# Patient Record
Sex: Male | Born: 1970 | Race: White | Hispanic: No | Marital: Single | Smoking: Never smoker
Health system: Southern US, Community
[De-identification: ages and names within clinical notes are randomized; demographics above are authoritative.]

## PROBLEM LIST (undated history)

## (undated) DIAGNOSIS — K529 Noninfective gastroenteritis and colitis, unspecified: Secondary | ICD-10-CM

## (undated) DIAGNOSIS — F419 Anxiety disorder, unspecified: Secondary | ICD-10-CM

## (undated) DIAGNOSIS — G43909 Migraine, unspecified, not intractable, without status migrainosus: Secondary | ICD-10-CM

## (undated) DIAGNOSIS — I10 Essential (primary) hypertension: Secondary | ICD-10-CM

## (undated) DIAGNOSIS — T7840XA Allergy, unspecified, initial encounter: Secondary | ICD-10-CM

## (undated) DIAGNOSIS — K219 Gastro-esophageal reflux disease without esophagitis: Secondary | ICD-10-CM

## (undated) DIAGNOSIS — E785 Hyperlipidemia, unspecified: Secondary | ICD-10-CM

## (undated) DIAGNOSIS — R0981 Nasal congestion: Secondary | ICD-10-CM

## (undated) DIAGNOSIS — Z87442 Personal history of urinary calculi: Secondary | ICD-10-CM

## (undated) HISTORY — DX: Anxiety disorder, unspecified: F41.9

## (undated) HISTORY — DX: Personal history of urinary calculi: Z87.442

## (undated) HISTORY — DX: Nasal congestion: R09.81

## (undated) HISTORY — DX: Noninfective gastroenteritis and colitis, unspecified: K52.9

## (undated) HISTORY — DX: Allergy, unspecified, initial encounter: T78.40XA

## (undated) HISTORY — PX: WISDOM TOOTH EXTRACTION: SHX21

## (undated) HISTORY — DX: Hyperlipidemia, unspecified: E78.5

---

## 1997-07-04 HISTORY — PX: CYST REMOVAL NECK: SHX6281

## 2008-04-11 ENCOUNTER — Ambulatory Visit: Payer: Self-pay | Admitting: Family Medicine

## 2009-02-11 IMAGING — CR PARANASAL SINUSES - COMPLETE 3 + VIEW
1 series · 6 of 6 positions shown · non-contrast
Comparison: none

REASON FOR EXAM: COUGH  WHEEZE
COMMENTS:

PROCEDURE:     DXR - DXR SINUSES PARANASAL COMPLETE  - April 11, 2008 [DATE]
RESULT:     The paranasal sinuses are well aerated without evidence of air
fluid levels nor mucosal thickening. The visualized bony skeleton is
unremarkable.

[Series 1: view not recorded · 0.17mm/px · 6 of 6 slices shown]
[im 1/6]
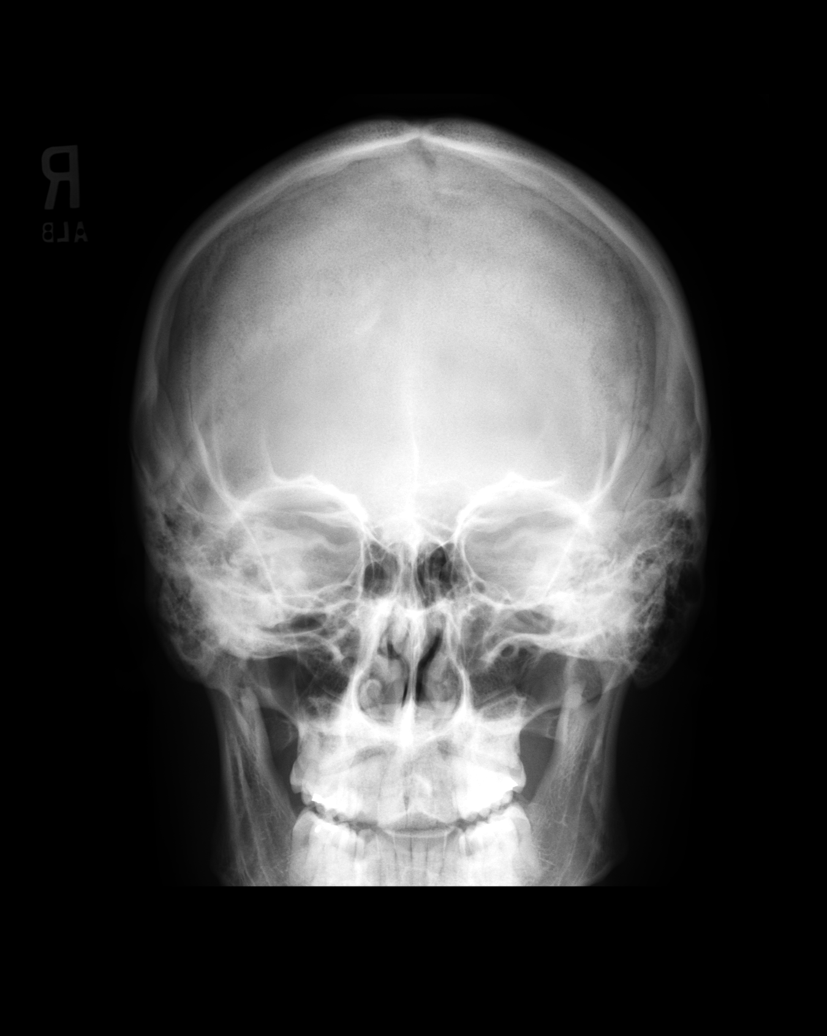
[im 2/6]
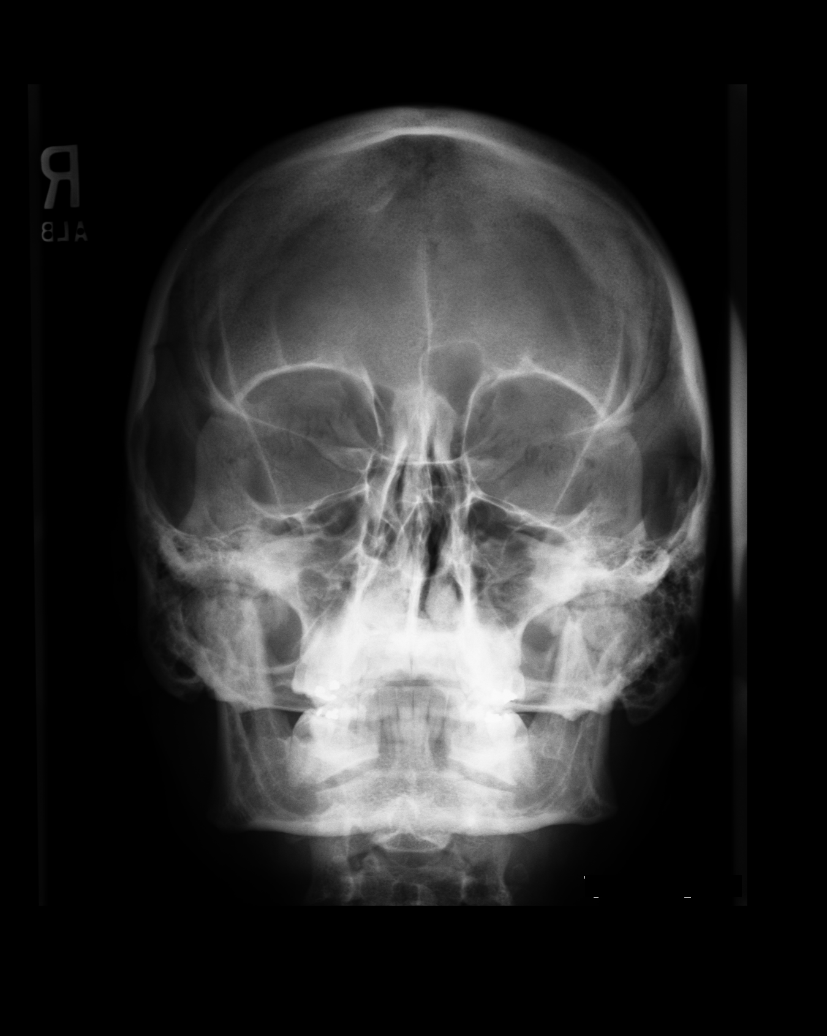
[im 3/6]
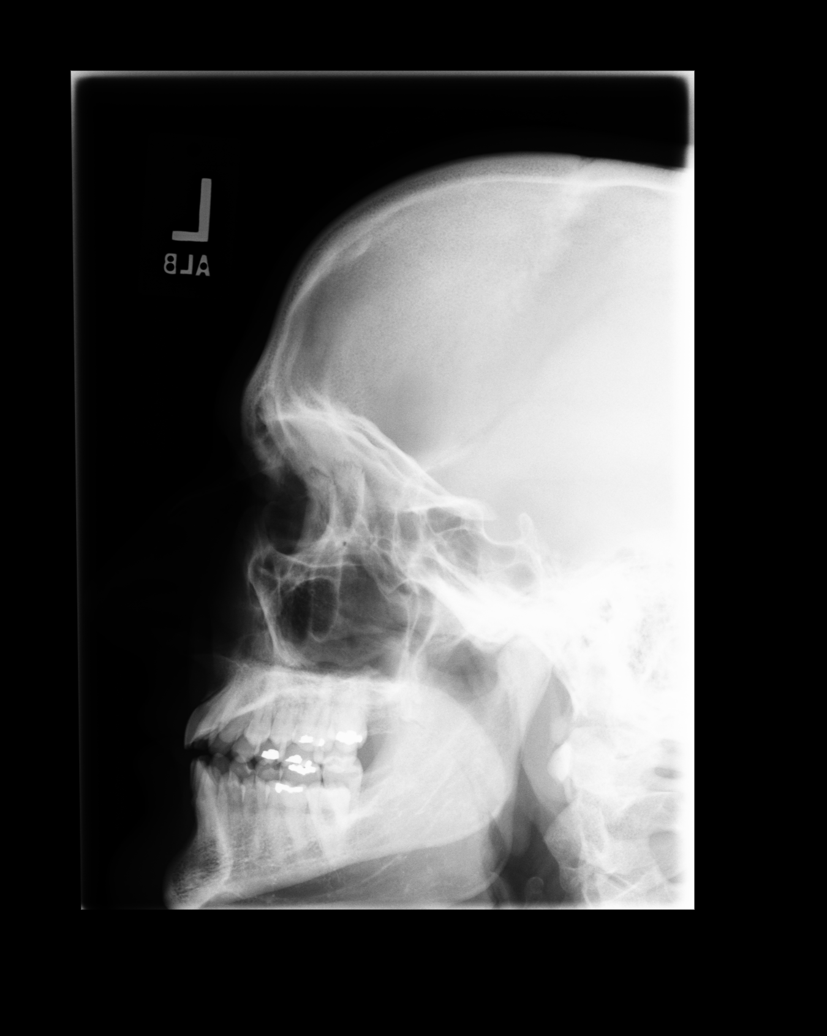
[im 4/6]
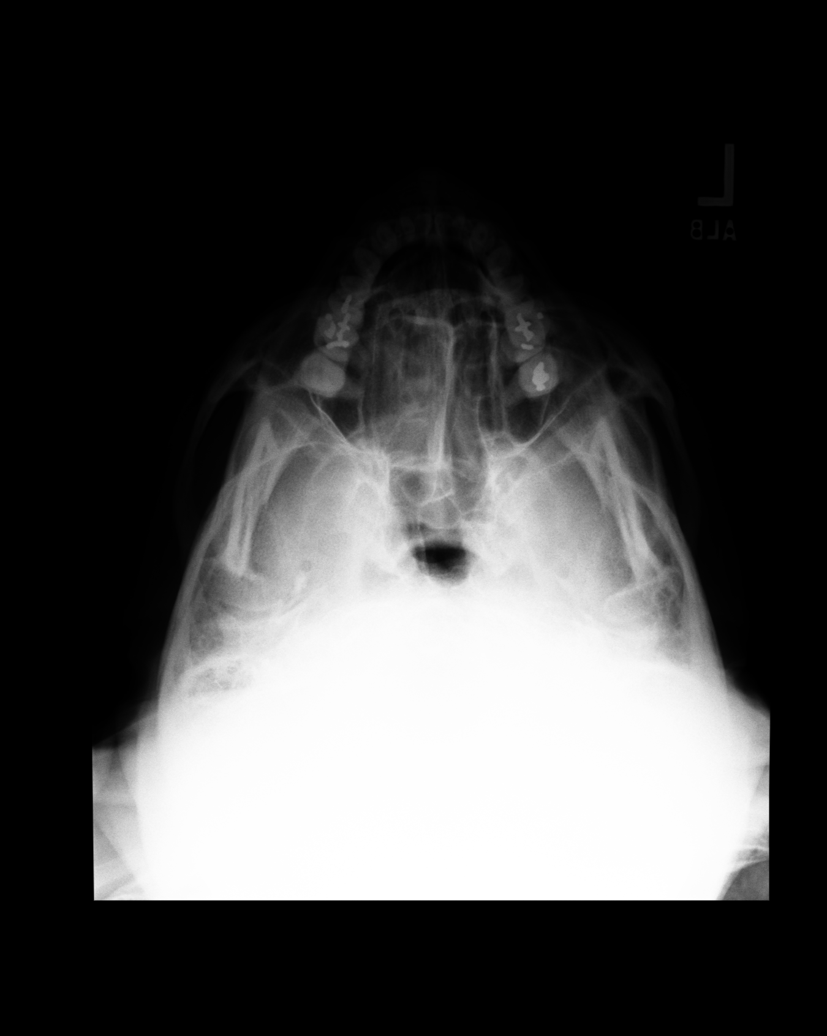
[im 5/6]
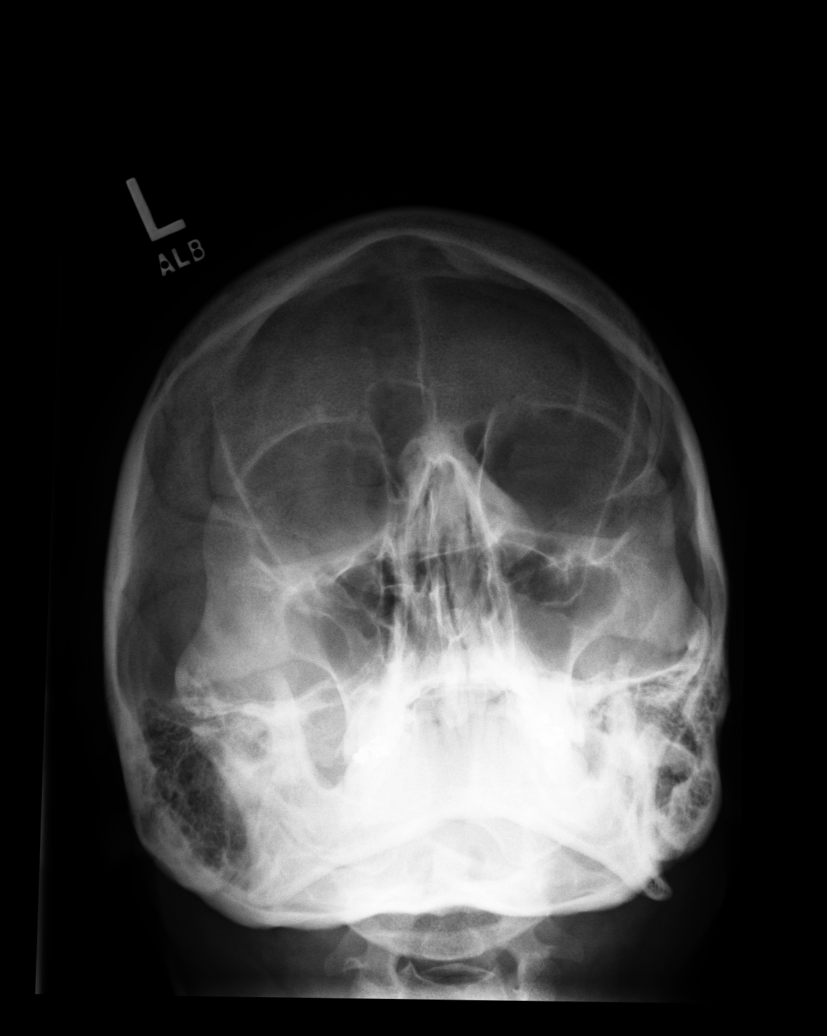
[im 6/6]
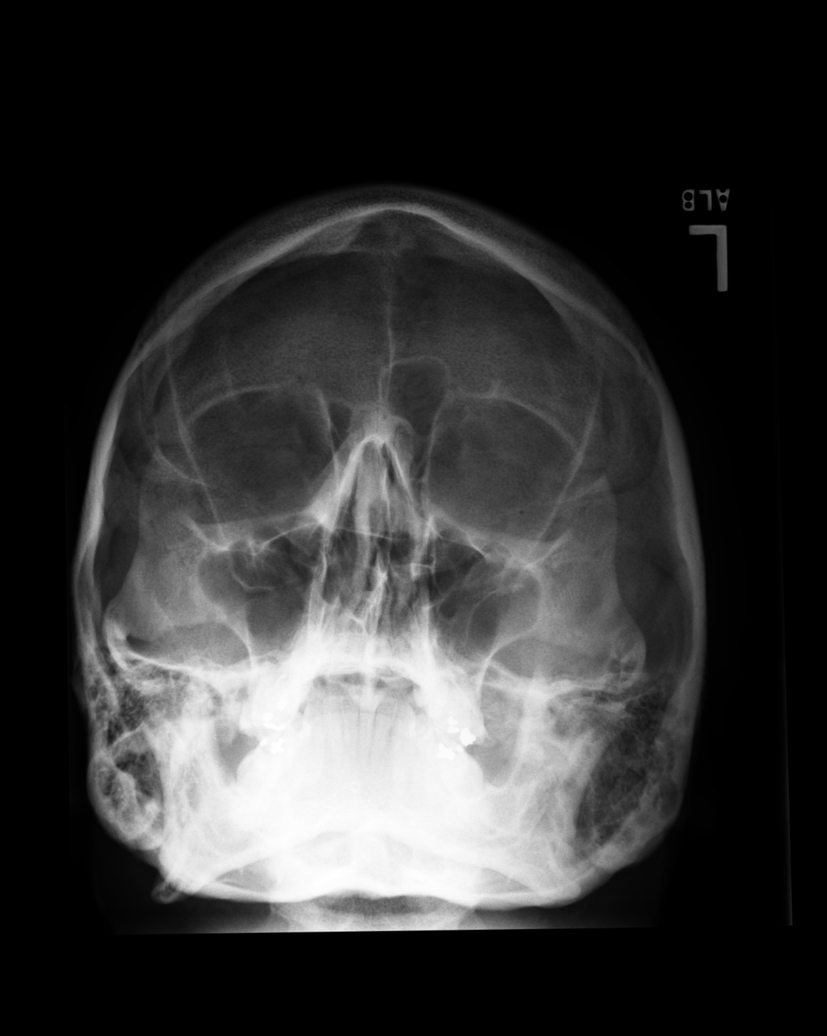

[6 of 6 positions shown; findings below may reference images not displayed]

IMPRESSION: 1. No radiographic evidence of sinus disease. If there is persistent
clinical concern, further evaluation with sinus CT is recommended.

## 2014-12-15 ENCOUNTER — Other Ambulatory Visit: Payer: Self-pay | Admitting: Family Medicine

## 2014-12-21 ENCOUNTER — Other Ambulatory Visit: Payer: Self-pay | Admitting: Family Medicine

## 2014-12-29 ENCOUNTER — Other Ambulatory Visit: Payer: Self-pay | Admitting: Family Medicine

## 2015-01-14 ENCOUNTER — Encounter: Payer: Self-pay | Admitting: Family Medicine

## 2015-01-14 ENCOUNTER — Encounter (INDEPENDENT_AMBULATORY_CARE_PROVIDER_SITE_OTHER): Payer: Self-pay

## 2015-01-14 ENCOUNTER — Ambulatory Visit (INDEPENDENT_AMBULATORY_CARE_PROVIDER_SITE_OTHER): Payer: BC Managed Care – PPO | Admitting: Family Medicine

## 2015-01-14 VITALS — BP 130/78 | HR 75 | Temp 98.5°F | Resp 16 | Ht 68.0 in | Wt 202.0 lb

## 2015-01-14 DIAGNOSIS — M25512 Pain in left shoulder: Secondary | ICD-10-CM | POA: Diagnosis not present

## 2015-01-14 DIAGNOSIS — M25511 Pain in right shoulder: Secondary | ICD-10-CM

## 2015-01-14 DIAGNOSIS — J0100 Acute maxillary sinusitis, unspecified: Secondary | ICD-10-CM | POA: Diagnosis not present

## 2015-01-14 DIAGNOSIS — K219 Gastro-esophageal reflux disease without esophagitis: Secondary | ICD-10-CM | POA: Insufficient documentation

## 2015-01-14 DIAGNOSIS — G43109 Migraine with aura, not intractable, without status migrainosus: Secondary | ICD-10-CM | POA: Diagnosis not present

## 2015-01-14 HISTORY — DX: Pain in left shoulder: M25.512

## 2015-01-14 HISTORY — DX: Pain in right shoulder: M25.511

## 2015-01-14 MED ORDER — AZITHROMYCIN 250 MG PO TABS: ORAL_TABLET | ORAL | Status: DC

## 2015-01-14 MED ORDER — PREDNISONE 20 MG PO TABS
20.0000 mg | ORAL_TABLET | Freq: Two times a day (BID) | ORAL | Status: DC
Start: 2015-01-14 — End: 2015-03-27

## 2015-01-14 NOTE — Patient Instructions (Signed)
Sinusitis Sinusitis is redness, soreness, and inflammation of the paranasal sinuses. Paranasal sinuses are air pockets within the bones of your face (beneath the eyes, the middle of the forehead, or above the eyes). In healthy paranasal sinuses, mucus is able to drain out, and air is able to circulate through them by way of your nose. However, when your paranasal sinuses are inflamed, mucus and air can become trapped. This can allow bacteria and other germs to grow and cause infection. Sinusitis can develop quickly and last only a short time (acute) or continue over a long period (chronic). Sinusitis that lasts for more than 12 weeks is considered chronic.  CAUSES  Causes of sinusitis include:  Allergies.  Structural abnormalities, such as displacement of the cartilage that separates your nostrils (deviated septum), which can decrease the air flow through your nose and sinuses and affect sinus drainage.  Functional abnormalities, such as when the small hairs (cilia) that line your sinuses and help remove mucus do not work properly or are not present. SIGNS AND SYMPTOMS  Symptoms of acute and chronic sinusitis are the same. The primary symptoms are pain and pressure around the affected sinuses. Other symptoms include:  Upper toothache.  Earache.  Headache.  Bad breath.  Decreased sense of smell and taste.  A cough, which worsens when you are lying flat.  Fatigue.  Fever.  Thick drainage from your nose, which often is green and may contain pus (purulent).  Swelling and warmth over the affected sinuses. DIAGNOSIS  Your health care provider will perform a physical exam. During the exam, your health care provider may:  Look in your nose for signs of abnormal growths in your nostrils (nasal polyps).  Tap over the affected sinus to check for signs of infection.  View the inside of your sinuses (endoscopy) using an imaging device that has a light attached (endoscope). If your health  care provider suspects that you have chronic sinusitis, one or more of the following tests may be recommended:  Allergy tests.  Nasal culture. A sample of mucus is taken from your nose, sent to a lab, and screened for bacteria.  Nasal cytology. A sample of mucus is taken from your nose and examined by your health care provider to determine if your sinusitis is related to an allergy. TREATMENT  Most cases of acute sinusitis are related to a viral infection and will resolve on their own within 10 days. Sometimes medicines are prescribed to help relieve symptoms (pain medicine, decongestants, nasal steroid sprays, or saline sprays).  However, for sinusitis related to a bacterial infection, your health care provider will prescribe antibiotic medicines. These are medicines that will help kill the bacteria causing the infection.  Rarely, sinusitis is caused by a fungal infection. In theses cases, your health care provider will prescribe antifungal medicine. For some cases of chronic sinusitis, surgery is needed. Generally, these are cases in which sinusitis recurs more than 3 times per year, despite other treatments. HOME CARE INSTRUCTIONS   Drink plenty of water. Water helps thin the mucus so your sinuses can drain more easily.  Use a humidifier.  Inhale steam 3 to 4 times a day (for example, sit in the bathroom with the shower running).  Apply a warm, moist washcloth to your face 3 to 4 times a day, or as directed by your health care provider.  Use saline nasal sprays to help moisten and clean your sinuses.  Take medicines only as directed by your health care provider.    If you were prescribed either an antibiotic or antifungal medicine, finish it all even if you start to feel better. SEEK IMMEDIATE MEDICAL CARE IF:  You have increasing pain or severe headaches.  You have nausea, vomiting, or drowsiness.  You have swelling around your face.  You have vision problems.  You have a stiff  neck.  You have difficulty breathing. MAKE SURE YOU:   Understand these instructions.  Will watch your condition.  Will get help right away if you are not doing well or get worse. Document Released: 06/20/2005 Document Revised: 11/04/2013 Document Reviewed: 07/05/2011 [facility] Patient Information 2015 [place], [place]. This information is not intended to replace advice given to you by your health care provider. Make sure you discuss any questions you have with your health care provider. Obesity Obesity is defined as having too much total body fat and a body mass index (BMI) of 30 or more. BMI is an estimate of body fat and is calculated from your height and weight. Obesity happens when you consume more calories than you can burn by exercising or performing daily physical tasks. Prolonged obesity can cause major illnesses or emergencies, such as:   Stroke.  Heart disease.  Diabetes.  Cancer.  Arthritis.  High blood pressure (hypertension).  High cholesterol.  Sleep apnea.  Erectile dysfunction.  Infertility problems. CAUSES   Regularly eating unhealthy foods.  Physical inactivity.  Certain disorders, such as an underactive thyroid (hypothyroidism), Cushing's syndrome, and polycystic ovarian syndrome.  Certain medicines, such as steroids, some depression medicines, and antipsychotics.  Genetics.  Lack of sleep. DIAGNOSIS  A health care provider can diagnose obesity after calculating your BMI. Obesity will be diagnosed if your BMI is 30 or higher.  There are other methods of measuring obesity levels. Some other methods include measuring your skinfold thickness, your waist circumference, and comparing your hip circumference to your waist circumference. TREATMENT  A healthy treatment program includes some or all of the following:  Long-term dietary changes.  Exercise and physical activity.  Behavioral and lifestyle changes.  Medicine only under the supervision of  your health care provider. Medicines may help, but only if they are used with diet and exercise programs. An unhealthy treatment program includes:  Fasting.  Fad diets.  Supplements and drugs. These choices do not succeed in long-term weight control.  HOME CARE INSTRUCTIONS   Exercise and perform physical activity as directed by your health care provider. To increase physical activity, try the following:  Use stairs instead of elevators.  Park farther away from store entrances.  Garden, bike, or walk instead of watching television or using the computer.  Eat healthy, low-calorie foods and drinks on a regular basis. Eat more fruits and vegetables. Use low-calorie cookbooks or take healthy cooking classes.  Limit fast food, sweets, and processed snack foods.  Eat smaller portions.  Keep a daily journal of everything you eat. There are many free websites to help you with this. It may be helpful to measure your foods so you can determine if you are eating the correct portion sizes.  Avoid drinking alcohol. Drink more water and drinks without calories.  Take vitamins and supplements only as recommended by your health care provider.  Weight-loss support groups, Tax adviser, counselors, and stress reduction education can also be very helpful. SEEK IMMEDIATE MEDICAL CARE IF:  You have chest pain or tightness.  You have trouble breathing or feel short of breath.  You have weakness or leg numbness.  You feel confused  or have trouble talking.  You have sudden changes in your vision. MAKE SURE YOU:  Understand these instructions.  Will watch your condition.  Will get help right away if you are not doing well or get worse. Document Released: 07/28/2004 Document Revised: 11/04/2013 Document Reviewed: 07/27/2011 [facility] Patient Information 2015 [place], [place]. This information is not intended to replace advice given to you by your health care provider. Make sure you  discuss any questions you have with your health care provider.

## 2015-01-14 NOTE — Progress Notes (Signed)
Name: Don Patterson   MRN: [id number]    DOB: 1971-05-23   Date:01/14/2015       Progress Note  Subjective  Chief Complaint  Chief Complaint  Patient presents with  . Headache  . Shoulder Pain    pt has had some sinus issues recently and is not sure if these symtoms are related  . Neck Pain  . Pressure Behind the Eyes    Headache  This is a recurrent problem. The current episode started 1 to 4 weeks ago. The problem occurs daily. The problem has been gradually worsening. The pain is located in the bilateral region. The pain radiates to the right shoulder and left shoulder. The pain quality is similar to prior headaches. The quality of the pain is described as aching. The pain is moderate. Associated symptoms include coughing, eye pain, neck pain and sinus pressure. Pertinent negatives include no back pain, blurred vision, dizziness, eye redness, fever, hearing loss, insomnia, nausea, seizures, sore throat, tingling, tinnitus, vomiting, weakness or weight loss. The symptoms are aggravated by weather changes, noise and emotional stress. He has tried acetaminophen and darkened room for the symptoms. The treatment provided mild relief. His past medical history is significant for migraines in the family and sinus disease. (Allergic rhinitis)  Shoulder Pain  The pain is present in the left shoulder and right shoulder. This is a new problem. The current episode started 1 to 4 weeks ago. There has been no history of extremity trauma. The problem occurs daily. The problem has been gradually worsening. The quality of the pain is described as dull. The pain is mild. Pertinent negatives include no fever, itching or tingling. The symptoms are aggravated by activity. He has tried acetaminophen, heat and cold for the symptoms. The treatment provided mild relief. Family history does not include rheumatoid arthritis. His past medical history is significant for osteoarthritis. There is no history of diabetes or  gout. Allergic rhinitis  Neck Pain  Associated symptoms include headaches. Pertinent negatives include no chest pain, fever, tingling, weakness or weight loss.  Sinusitis This is a recurrent problem. The current episode started 1 to 4 weeks ago. The problem has been gradually worsening since onset. There has been no fever. The pain is moderate. Associated symptoms include congestion, coughing, headaches, neck pain, sinus pressure and sneezing. Pertinent negatives include no chills, shortness of breath or sore throat. Past treatments include saline sprays and acetaminophen. The treatment provided no relief.  Gastrophageal Reflux He complains of coughing and heartburn. He reports no chest pain, no nausea or no sore throat. This is a chronic problem. The current episode started more than 1 year ago. The problem occurs frequently. The problem has been gradually improving. The symptoms are aggravated by certain foods, bending, medications and stress. Associated symptoms include fatigue and muscle weakness. Pertinent negatives include no weight loss. Risk factors include caffeine use and obesity. He has tried a PPI for the symptoms. The treatment provided moderate relief.      Past Medical History  Diagnosis Date  . Sinus congestion   . Allergy   . Colitis     History  Substance Use Topics  . Smoking status: Never Smoker   . Smokeless tobacco: Not on file  . Alcohol Use: 0.0 oz/week    0 Standard drinks or equivalent per week     Current outpatient prescriptions:  .  levocetirizine (XYZAL) 5 MG tablet, TAKE 1 TABLET BY MOUTH EVERY DAY, Disp: 30 tablet, Rfl: 1 .  omeprazole (PRILOSEC) 40 MG capsule, TAKE ONE CAPSULE BY MOUTH EVERY DAY, Disp: 90 capsule, Rfl: 3 .  sodium chloride (OCEAN) 0.65 % SOLN nasal spray, Place 1 spray into both nostrils as needed for congestion., Disp: , Rfl:   No Known Allergies  Review of Systems  Constitutional: Positive for fatigue. Negative for fever, chills  and weight loss.  HENT: Positive for congestion, sinus pressure and sneezing. Negative for hearing loss, sore throat and tinnitus.   Eyes: Positive for pain. Negative for blurred vision, double vision and redness.  Respiratory: Positive for cough. Negative for hemoptysis and shortness of breath.   Cardiovascular: Negative for chest pain, palpitations, orthopnea, claudication and leg swelling.  Gastrointestinal: Positive for heartburn. Negative for nausea, vomiting, diarrhea, constipation and blood in stool.  Genitourinary: Negative for dysuria, urgency, frequency and hematuria.  Musculoskeletal: Positive for joint pain, muscle weakness and neck pain. Negative for myalgias, back pain, falls and gout.  Skin: Negative for itching.  Neurological: Positive for headaches. Negative for dizziness, tingling, tremors, focal weakness, seizures, loss of consciousness and weakness.  Endo/Heme/Allergies: Does not bruise/bleed easily.  Psychiatric/Behavioral: Negative for depression and substance abuse. The patient is not nervous/anxious and does not have insomnia.      Objective  Filed Vitals:   01/14/15 1127  BP: 130/78  Pulse: 75  Temp: 98.5 F (36.9 C)  Resp: 16  Height: 5\' 8"  (1.727 m)  Weight: 202 lb (91.627 kg)  SpO2: 98%     Physical Exam  Constitutional: He is oriented to person, place, and time and well-developed, well-nourished, and in no distress.  HENT:  Head: Normocephalic.  Tender over the frontal and ethmoid areas  Eyes: EOM are normal. Pupils are equal, round, and reactive to light.  Neck: Normal range of motion. Neck supple. No thyromegaly present.  Cardiovascular: Normal rate, regular rhythm and normal heart sounds.   No murmur heard. Pulmonary/Chest: Effort normal and breath sounds normal. No respiratory distress. He has no wheezes.  Abdominal: Soft. Bowel sounds are normal.  Musculoskeletal: Normal range of motion. He exhibits no edema.  Lymphadenopathy:    He has no  cervical adenopathy.  Neurological: He is alert and oriented to person, place, and time. No cranial nerve deficit. Gait normal. Coordination normal.  Skin: Skin is warm and dry. No rash noted.  Psychiatric: Affect and judgment normal.      Assessment & Plan  1. Acute maxillary sinusitis, recurrence not specified Recurrent - sodium chloride (OCEAN) 0.65 % SOLN nasal spray; Place 1 spray into both nostrils as needed for congestion. - predniSONE (DELTASONE) 20 MG tablet; Take 1 tablet (20 mg total) by mouth 2 (two) times daily.  Dispense: 10 tablet; Refill: 0 - azithromycin (ZITHROMAX) 250 MG tablet; 2x1 day then once daily  Dispense: 6 tablet; Refill: 0  2. Migraine aura without headache Symptomatic treatment with over-the-counter meds  3. Gastroesophageal reflux disease without esophagitis Continue PPI  4. Bilateral shoulder pain The common meds and relief almost like a recurrence of sinusitis is improving

## 2015-01-19 ENCOUNTER — Other Ambulatory Visit: Payer: Self-pay

## 2015-01-19 ENCOUNTER — Emergency Department: Payer: BC Managed Care – PPO

## 2015-01-19 ENCOUNTER — Encounter: Payer: Self-pay | Admitting: Emergency Medicine

## 2015-01-19 ENCOUNTER — Emergency Department
Admission: EM | Admit: 2015-01-19 | Discharge: 2015-01-20 | Disposition: A | Discharge status: Home / Self Care | Payer: BC Managed Care – PPO | Attending: Emergency Medicine | Admitting: Emergency Medicine

## 2015-01-19 DIAGNOSIS — Z792 Long term (current) use of antibiotics: Secondary | ICD-10-CM | POA: Insufficient documentation

## 2015-01-19 DIAGNOSIS — Z7952 Long term (current) use of systemic steroids: Secondary | ICD-10-CM | POA: Diagnosis not present

## 2015-01-19 DIAGNOSIS — Z79899 Other long term (current) drug therapy: Secondary | ICD-10-CM | POA: Diagnosis not present

## 2015-01-19 DIAGNOSIS — R000 Tachycardia, unspecified: Secondary | ICD-10-CM | POA: Diagnosis present

## 2015-01-19 DIAGNOSIS — R002 Palpitations: Secondary | ICD-10-CM | POA: Diagnosis not present

## 2015-01-19 HISTORY — DX: Migraine, unspecified, not intractable, without status migrainosus: G43.909

## 2015-01-19 LAB — BASIC METABOLIC PANEL
Anion gap: 7 (ref 5–15)
BUN: 17 mg/dL (ref 6–20)
CHLORIDE: 105 mmol/L (ref 101–111)
CO2: 24 mmol/L (ref 22–32)
Calcium: 9.4 mg/dL (ref 8.9–10.3)
Creatinine, Ser: 1.11 mg/dL (ref 0.61–1.24)
GFR calc Af Amer: >60 mL/min (ref >60)
GLUCOSE: 104 mg/dL — AB (ref 65–99)
POTASSIUM: 3.3 mmol/L — AB (ref 3.5–5.1)
Sodium: 136 mmol/L (ref 135–145)

## 2015-01-19 LAB — CBC
HEMATOCRIT: 44.4 % (ref 40.0–52.0)
Hemoglobin: 15.1 g/dL (ref 13.0–18.0)
MCH: 29.7 pg (ref 26.0–34.0)
MCHC: 33.9 g/dL (ref 32.0–36.0)
MCV: 87.7 fL (ref 80.0–100.0)
Platelets: 237 10*3/uL (ref 150–440)
RBC: 5.06 MIL/uL (ref 4.40–5.90)
RDW: 13.4 % (ref 11.5–14.5)
WBC: 14 10*3/uL — ABNORMAL HIGH (ref 3.8–10.6)

## 2015-01-19 LAB — TROPONIN I

## 2015-01-19 NOTE — ED Notes (Signed)
Patient ambulatory to triage with steady gait, without difficulty or distress noted; pt st seen PCP last Wed and rx med for sinus drainage (prednisone); today began taking azithromycin--1ds at supper time (didn't realize he had 2 rx called in); st began feeling warm, "achy" to right arm/shoulder and noted heart rate was elevated; denies any rashes or allergies; pt denies any hx of same

## 2015-01-19 NOTE — ED Notes (Signed)
Patient thinks he may have had a reaction to azithromycin. Took 500 mg today. Has been on prednisone since last Wednesday for sinus infection. Was supposed to be taking the azithromycin also. States pain in right arm and shoulder  has improved since coming here.

## 2015-01-19 NOTE — ED Notes (Signed)
Returned from xray

## 2015-01-20 ENCOUNTER — Encounter: Payer: Self-pay | Admitting: Family Medicine

## 2015-01-20 ENCOUNTER — Telehealth: Payer: Self-pay | Admitting: Family Medicine

## 2015-01-20 LAB — TSH: TSH: 2.322 u[IU]/mL (ref 0.350–4.500)

## 2015-01-20 LAB — MAGNESIUM: MAGNESIUM: 2.3 mg/dL (ref 1.7–2.4)

## 2015-01-20 LAB — TROPONIN I: Troponin I: <0.03 ng/mL (ref <0.031)

## 2015-01-20 NOTE — ED Provider Notes (Signed)
[facility] Emergency Department Provider Note  ____________________________________________  Time seen: Approximately 2313 PM  I have reviewed the triage vital signs and the nursing notes.   HISTORY  Chief Complaint Tachycardia   HPI Don Patterson is a 44 y.o. male who comes in with palpitations tonight. The patient reports that he took a Z-Pak at 1800 this evening. He reports that as he was sitting there he felt his heart rate go up. He reports that he also checked his heart rate on his watch and it was approximately 110 bpm. The patient reports that he started feeling achy in his neck and right arm. The patient reports that he was eating supper when the symptoms started and he was drinking root beer which had caffeine in it. The patient reports that he saw his physician last week with achiness and a sinus infection. The patient reports that he was started on prednisone as well as a Z-Pak. The patient reports that he sat trying to figure out what was going on and then called a friend to bring him over to the hospital. He reports that the pain in his neck and his arm are dull and on the right side. His pain is 3-4 out of 10 in intensity. He reports that initially he did feel hot and sweaty and had a mild upset stomach but did not have any chest pain any shortness of breath any vomiting. The patient has never had this happen in the past.   Past Medical History  Diagnosis Date  . Sinus congestion   . Allergy   . Colitis   . Migraine     Patient Active Problem List   Diagnosis Date Noted  . Acute maxillary sinusitis 01/14/2015  . Migraine aura without headache 01/14/2015  . Gastroesophageal reflux disease without esophagitis 01/14/2015  . Bilateral shoulder pain 01/14/2015    Past Surgical History  Procedure Laterality Date  . Cyst removal neck Left 1999    Current Outpatient Rx  Name  Route  Sig  Dispense  Refill  . azithromycin (ZITHROMAX) 250 MG  tablet      2x1 day then once daily   6 tablet   0   . levocetirizine (XYZAL) 5 MG tablet      TAKE 1 TABLET BY MOUTH EVERY DAY   30 tablet   1   . omeprazole (PRILOSEC) 40 MG capsule      TAKE ONE CAPSULE BY MOUTH EVERY DAY   90 capsule   3   . predniSONE (DELTASONE) 20 MG tablet   Oral   Take 1 tablet (20 mg total) by mouth 2 (two) times daily.   10 tablet   0   . sodium chloride (OCEAN) 0.65 % SOLN nasal spray   Each Nare   Place 1 spray into both nostrils as needed for congestion.           Allergies Review of patient's allergies indicates no known allergies.  Family History  Problem Relation Age of Onset  . Cancer Mother   . Heart disease Father     Social History History  Substance Use Topics  . Smoking status: Never Smoker   . Smokeless tobacco: Not on file  . Alcohol Use: 0.0 oz/week    0 Standard drinks or equivalent per week     Comment: rarely    Review of Systems Constitutional: No fever/chills Eyes: No visual changes. ENT: No sore throat. Cardiovascular: Denies chest pain. Respiratory: Denies shortness  of breath. Gastrointestinal: No abdominal pain.  No nausea, no vomiting.  No diarrhea.  No constipation. Genitourinary: Negative for dysuria. Musculoskeletal: Body aches, right neck pain, right arm pain Skin: Negative for rash. Neurological: Negative for headaches, focal weakness or numbness.  10-point ROS otherwise negative.  ____________________________________________   PHYSICAL EXAM:  VITAL SIGNS: ED Triage Vitals  Enc Vitals Group     BP 01/19/15 2049 144/82 mmHg     Pulse Rate 01/19/15 2049 76     Resp 01/19/15 2049 18     Temp 01/19/15 2049 98 F (36.7 C)     Temp Source 01/19/15 2049 Oral     SpO2 01/19/15 2049 99 %     Weight 01/19/15 2049 202 lb (91.627 kg)     Height 01/19/15 2049 5\' 7"  (1.702 m)     Head Cir --      Peak Flow --      Pain Score 01/19/15 2049 5     Pain Loc --      Pain Edu? --      Excl. in  [place]? --     Constitutional: Alert and oriented. Well appearing and in mild distress. Eyes: Conjunctivae are normal. PERRL. EOMI. Head: Atraumatic. Nose: No congestion/rhinnorhea. Mouth/Throat: Mucous membranes are moist.  Oropharynx non-erythematous. Neck: No right-sided tenderness to palpation Cardiovascular: Normal rate, regular rhythm. Grossly normal heart sounds.  Good peripheral circulation. Respiratory: Normal respiratory effort.  No retractions. Lungs CTAB. Gastrointestinal: Soft and nontender. No distention. Positive bowel sounds Genitourinary: Deferred Musculoskeletal: No lower extremity tenderness nor edema.   Neurologic:  Normal speech and language. No gross focal neurologic deficits are appreciated.  Skin:  Skin is warm, dry and intact. No rash noted. Psychiatric: Mood and affect are normal.   ____________________________________________   LABS (all labs ordered are listed, but only abnormal results are displayed)  Labs Reviewed  BASIC METABOLIC PANEL - Abnormal; Notable for the following:    Potassium 3.3 (*)    Glucose, Bld 104 (*)    All other components within normal limits  CBC - Abnormal; Notable for the following:    WBC 14.0 (*)    All other components within normal limits  TROPONIN I  MAGNESIUM  TSH  TROPONIN I   ____________________________________________  EKG  ED ECG REPORT I, Loney Hering, the attending physician, personally viewed and interpreted this ECG.   Date: 01/20/2015  EKG Time: 2124  Rate: 66  Rhythm: normal sinus rhythm  Axis: normal  Intervals:none  ST&T Change: None  ____________________________________________  RADIOLOGY  Milta Deiters, personally viewed and evaluated these images as part of my medical decision making.    Chest X-ray: Mild vascular congestion noted, lungs remain grossly clear ____________________________________________   PROCEDURES  Procedure(s) performed: None  Critical Care  performed: No  ____________________________________________   INITIAL IMPRESSION / ASSESSMENT AND PLAN / ED COURSE  Pertinent labs & imaging results that were available during my care of the patient were reviewed by me and considered in my medical decision making (see chart for details).  Patient is a 44 year old male who comes in tonight with palpitations. We will check some blood work and reassess the patient once we've received the results of his blood work and x-ray. The patient is not having symptoms of palpitations anymore. It is likely that the symptoms started due to the caffeine intake. We'll reassess once we have received all of his blood work.  ----------------------------------------- 1:32 AM on 01/20/2015 -----------------------------------------  The patient's blood work is unremarkable. He does have a mildly low potassium at 3.3 but otherwise has no other concerns. The patient does have an elevated white blood cell count but has been recently on prednisone which can erroneously elevate his white blood cell count. At this time and is unsure the cause of his palpitations but I will have him stop taking his azithromycin as it may have caused his palpitations as well as have him hold caffeine at this time as it may have also causes palpitations. I will have the patient follow back up with his primary care physician as well as cardiology for further evaluation of his heart. Otherwise the patient has no other complaints or concerns be discharged home. ____________________________________________   FINAL CLINICAL IMPRESSION(S) / ED DIAGNOSES  Final diagnoses:  Palpitations      Loney Hering, MD 01/20/15 0134

## 2015-01-20 NOTE — Telephone Encounter (Signed)
Pt would like a call back as soon as you can.

## 2015-01-20 NOTE — Discharge Instructions (Signed)

## 2015-01-21 ENCOUNTER — Other Ambulatory Visit: Payer: Self-pay

## 2015-01-21 DIAGNOSIS — R000 Tachycardia, unspecified: Secondary | ICD-10-CM

## 2015-02-22 ENCOUNTER — Other Ambulatory Visit: Payer: Self-pay | Admitting: Family Medicine

## 2015-03-13 ENCOUNTER — Ambulatory Visit: Payer: BC Managed Care – PPO | Admitting: Cardiovascular Disease

## 2015-03-27 ENCOUNTER — Ambulatory Visit (INDEPENDENT_AMBULATORY_CARE_PROVIDER_SITE_OTHER): Payer: BC Managed Care – PPO | Admitting: Cardiovascular Disease

## 2015-03-27 ENCOUNTER — Encounter: Payer: Self-pay | Admitting: Cardiovascular Disease

## 2015-03-27 VITALS — BP 173/77 | HR 64 | Ht 68.0 in | Wt 196.0 lb

## 2015-03-27 DIAGNOSIS — R000 Tachycardia, unspecified: Secondary | ICD-10-CM

## 2015-03-27 DIAGNOSIS — R002 Palpitations: Secondary | ICD-10-CM | POA: Diagnosis not present

## 2015-03-27 NOTE — Patient Instructions (Signed)
Medication Instructions: No changes.  Labwork: None.   Procedures/Testing: None.   Follow-Up: As needed .   Any Additional Special Instructions Will Be Listed Below (If Applicable).   

## 2015-03-29 ENCOUNTER — Encounter: Payer: Self-pay | Admitting: Cardiovascular Disease

## 2015-03-29 DIAGNOSIS — R002 Palpitations: Secondary | ICD-10-CM

## 2015-03-29 HISTORY — DX: Palpitations: R00.2

## 2015-03-29 NOTE — Progress Notes (Signed)
Primary care physician: Dr. Rutherford Nail.  HPI  This is a pleasant 44 year old male who was referred for evaluation of palpitations. He has no previous cardiac history and no significant chronic medical conditions. He was seen in July for sinusitis and was prescribed prednisone and azithromycin. He took prednisone without any side effects. However, he started taking azithromycin, he noticed elevated heart rate on his watch. His heart rate was 110 bpm and he felt some mild palpitations. On that same day, he consumed more than his usual amount of caffeine. No chest pain or shortness of breath. No syncope or presyncope. He went to the emergency room and was found to have negative basic workup. He has not had any recurrent symptoms since then over the last few months. He reports that he is somewhat sensitive to caffeine. He is not a smoker and does not drink alcohol. There is family history of valvular heart disease but no ischemic heart disease or sudden death.  No Known Allergies   Current Outpatient Prescriptions on File Prior to Visit  Medication Sig Dispense Refill  . levocetirizine (XYZAL) 5 MG tablet TAKE 1 TABLET BY MOUTH EVERY DAY 30 tablet 1  . omeprazole (PRILOSEC) 40 MG capsule TAKE ONE CAPSULE BY MOUTH EVERY DAY 90 capsule 3  . sodium chloride (OCEAN) 0.65 % SOLN nasal spray Place 1 spray into both nostrils as needed for congestion.     No current facility-administered medications on file prior to visit.     Past Medical History  Diagnosis Date  . Sinus congestion   . Allergy   . Colitis   . Migraine   . History of kidney stones      Past Surgical History  Procedure Laterality Date  . Cyst removal neck Left 1999     Family History  Problem Relation Age of Onset  . Cancer Mother   . Heart disease Father   . Heart disease Paternal Uncle      Social History   Social History  . Marital Status: Single    Spouse Name: N/A  . Number of Children: N/A  . Years of  Education: N/A   Occupational History  . Not on file.   Social History Main Topics  . Smoking status: Never Smoker   . Smokeless tobacco: Not on file  . Alcohol Use: 0.0 oz/week    0 Standard drinks or equivalent per week     Comment: rarely  . Drug Use: No  . Sexual Activity: Not on file   Other Topics Concern  . Not on file   Social History Narrative     ROS A 10 point review of system was performed. It is negative other than that mentioned in the history of present illness.  PHYSICAL EXAM   BP 173/77 mmHg  Pulse 64  Ht 5\' 8"  (1.727 m)  Wt 196 lb (88.905 kg)  BMI 29.81 kg/m2 Constitutional: He is oriented to person, place, and time. He appears well-developed and well-nourished. No distress.  HENT: No nasal discharge.  Head: Normocephalic and atraumatic.  Eyes: Pupils are equal and round.  No discharge. Neck: Normal range of motion. Neck supple. No JVD present. No thyromegaly present.  Cardiovascular: Normal rate, regular rhythm, normal heart sounds. Exam reveals no gallop and no friction rub. No murmur heard.  Pulmonary/Chest: Effort normal and breath sounds normal. No stridor. No respiratory distress. He has no wheezes. He has no rales. He exhibits no tenderness.  Abdominal: Soft. Bowel sounds are normal.  He exhibits no distension. There is no tenderness. There is no rebound and no guarding.  Musculoskeletal: Normal range of motion. He exhibits no edema and no tenderness.  Neurological: He is alert and oriented to person, place, and time. Coordination normal.  Skin: Skin is warm and dry. No rash noted. He is not diaphoretic. No erythema. No pallor.  Psychiatric: He has a normal mood and affect. His behavior is normal. Judgment and thought content normal.       QPR:FFMBW  Rhythm  WITHIN NORMAL LIMITS   ASSESSMENT AND PLAN

## 2015-03-29 NOTE — Assessment & Plan Note (Signed)
This was in the setting of using azithromycin and increased caffeine intake. He has not had any recurrent symptoms over the last few months. Thus, the utility of Holter monitor is very low. His cardiac physical exam is normal and baseline ECG is completely unremarkable. Thus, I don't really see a significant cardiac abnormality. I discussed with him limiting caffeine intake and avoiding these type of anti-biotics in the future. I asked him to follow-up with me if he has recurrent symptoms.

## 2015-04-01 ENCOUNTER — Ambulatory Visit (INDEPENDENT_AMBULATORY_CARE_PROVIDER_SITE_OTHER): Payer: BC Managed Care – PPO | Admitting: Family Medicine

## 2015-04-01 ENCOUNTER — Encounter: Payer: Self-pay | Admitting: Family Medicine

## 2015-04-01 VITALS — BP 132/72 | HR 77 | Temp 98.2°F | Resp 16 | Ht 68.0 in | Wt 192.6 lb

## 2015-04-01 DIAGNOSIS — H6503 Acute serous otitis media, bilateral: Secondary | ICD-10-CM

## 2015-04-01 DIAGNOSIS — J301 Allergic rhinitis due to pollen: Secondary | ICD-10-CM

## 2015-04-01 DIAGNOSIS — J0100 Acute maxillary sinusitis, unspecified: Secondary | ICD-10-CM

## 2015-04-01 MED ORDER — AMOXICILLIN-POT CLAVULANATE 875-125 MG PO TABS: 1.0000 | ORAL_TABLET | Freq: Two times a day (BID) | ORAL | Status: DC

## 2015-04-01 MED ORDER — PREDNISONE 20 MG PO TABS
20.0000 mg | ORAL_TABLET | Freq: Two times a day (BID) | ORAL | Status: DC
Start: 2015-04-01 — End: 2016-02-23

## 2015-04-01 MED ORDER — LEVOCETIRIZINE DIHYDROCHLORIDE 5 MG PO TABS: 5.0000 mg | ORAL_TABLET | Freq: Every day | ORAL | Status: DC

## 2015-04-01 NOTE — Progress Notes (Signed)
Name: Don Patterson   MRN: [id number]    DOB: 1970-10-13   Date:04/01/2015       Progress Note  Subjective  Chief Complaint  Chief Complaint  Patient presents with  . Sinus Problem  . Facial Pain    HPI  Sinusitis  Patient presents with greater than 7 day history of nasal congestion and drainage which is purulent in color. There is tenderness over the sinuses. There has been fever to none along with some associated chills on occasion. Usage of over-the-counter medications is not been affected. There is also accompanying cough productive of purulent sputum.  Otitis media  Complaint of bilateral ear pain since the onset of his other symptoms. He's had some mild chills but no documented fever but has felt warm.  Allergic rhinitis  Recurrent problem with allergic rhinitis this for which he takes Xyzal with recent improvement  Past Medical History  Diagnosis Date  . Sinus congestion   . Allergy   . Colitis   . Migraine   . History of kidney stones     Social History  Substance Use Topics  . Smoking status: Never Smoker   . Smokeless tobacco: Not on file  . Alcohol Use: 0.0 oz/week    0 Standard drinks or equivalent per week     Comment: rarely     Current outpatient prescriptions:  .  Cromolyn Sodium (NASAL ALLERGY NA), Place into the nose daily., Disp: , Rfl:  .  Flaxseed, Linseed, (FLAX SEEDS PO), Take by mouth daily., Disp: , Rfl:  .  levocetirizine (XYZAL) 5 MG tablet, TAKE 1 TABLET BY MOUTH EVERY DAY, Disp: 30 tablet, Rfl: 1 .  Omega-3 Fatty Acids (FISH OIL PO), Take by mouth daily., Disp: , Rfl:  .  omeprazole (PRILOSEC) 40 MG capsule, TAKE ONE CAPSULE BY MOUTH EVERY DAY, Disp: 90 capsule, Rfl: 3 .  sodium chloride (OCEAN) 0.65 % SOLN nasal spray, Place 1 spray into both nostrils as needed for congestion., Disp: , Rfl:   No Known Allergies  Review of Systems  Constitutional: Positive for fever and chills. Negative for weight loss.  HENT: Positive for  congestion and ear pain. Negative for hearing loss, sore throat and tinnitus.   Eyes: Negative for blurred vision, double vision and redness.  Respiratory: Positive for cough. Negative for hemoptysis and shortness of breath.   Cardiovascular: Negative for chest pain, palpitations, orthopnea, claudication and leg swelling.  Gastrointestinal: Negative for heartburn, nausea, vomiting, diarrhea, constipation and blood in stool.  Genitourinary: Negative for dysuria, urgency, frequency and hematuria.  Musculoskeletal: Negative for myalgias, back pain, joint pain, falls and neck pain.  Skin: Negative for itching.  Neurological: Positive for headaches. Negative for dizziness, tingling, tremors, focal weakness, seizures, loss of consciousness and weakness.  Endo/Heme/Allergies: Does not bruise/bleed easily.  Psychiatric/Behavioral: Negative for depression and substance abuse. The patient is not nervous/anxious and does not have insomnia.      Objective  Filed Vitals:   04/01/15 1225  BP: 132/72  Pulse: 77  Temp: 98.2 F (36.8 C)  Resp: 16  Height: 5\' 8"  (1.727 m)  Weight: 192 lb 9 oz (87.346 kg)  SpO2: 97%     Physical Exam  Constitutional: He is oriented to person, place, and time and well-developed, well-nourished, and in no distress.  HENT:  TMs are erythematous bilaterally. Nasal turbinates reveal mucopurulent discharge with erythematous swollen turbinates.  Eyes: EOM are normal. Pupils are equal, round, and reactive to light.  Neck: Normal range  of motion. Neck supple. No thyromegaly present.  Cardiovascular: Normal rate, regular rhythm and normal heart sounds.   No murmur heard. Pulmonary/Chest: Effort normal and breath sounds normal. No respiratory distress. He has no wheezes.  Abdominal: Bowel sounds are normal.  Musculoskeletal: Normal range of motion. He exhibits no edema.  Lymphadenopathy:    He has no cervical adenopathy.  Neurological: He is alert and oriented to person,  place, and time. No cranial nerve deficit. Gait normal. Coordination normal.  Skin: Skin is warm and dry. No rash noted.  Psychiatric: Affect and judgment normal.      Assessment & Plan  1. Subacute maxillary sinusitis Significantly painful - amoxicillin-clavulanate (AUGMENTIN) 875-125 MG tablet; Take 1 tablet by mouth 2 (two) times daily.  Dispense: 20 tablet; Refill: 0  2. Allergic rhinitis due to pollen Add steroid - predniSONE (DELTASONE) 20 MG tablet; Take 1 tablet (20 mg total) by mouth 2 (two) times daily with a meal.  Dispense: 10 tablet; Refill: 0 - levocetirizine (XYZAL) 5 MG tablet; Take 1 tablet (5 mg total) by mouth daily.  Dispense: 30 tablet; Refill: 1  3. Bilateral acute serous otitis media, recurrence not specified Significantly painful

## 2015-04-14 ENCOUNTER — Encounter: Payer: BC Managed Care – PPO | Admitting: Family Medicine

## 2015-05-18 ENCOUNTER — Ambulatory Visit (INDEPENDENT_AMBULATORY_CARE_PROVIDER_SITE_OTHER): Payer: BC Managed Care – PPO | Admitting: Family Medicine

## 2015-05-18 ENCOUNTER — Encounter: Payer: Self-pay | Admitting: Family Medicine

## 2015-05-18 VITALS — BP 124/62 | HR 71 | Temp 97.9°F | Resp 18 | Ht 68.0 in | Wt 196.5 lb

## 2015-05-18 DIAGNOSIS — Z0000 Encounter for general adult medical examination without abnormal findings: Secondary | ICD-10-CM

## 2015-05-18 NOTE — Progress Notes (Signed)
Name: Don Patterson   MRN: [id number]    DOB: May 27, 1971   Date:05/18/2015       Progress Note  Subjective  Chief Complaint  Chief Complaint  Patient presents with  . Annual Exam    HPI  44 year old male presenting for annual H&P. His allergic rhinitis and migraines are stable.  Past Medical History  Diagnosis Date  . Sinus congestion   . Allergy   . Colitis   . Migraine   . History of kidney stones     Social History  Substance Use Topics  . Smoking status: Never Smoker   . Smokeless tobacco: Not on file  . Alcohol Use: 0.0 oz/week    0 Standard drinks or equivalent per week     Comment: rarely     Current outpatient prescriptions:  .  amoxicillin-clavulanate (AUGMENTIN) 875-125 MG tablet, Take 1 tablet by mouth 2 (two) times daily. (Patient not taking: Reported on 05/18/2015), Disp: 20 tablet, Rfl: 0 .  Cromolyn Sodium (NASAL ALLERGY NA), Place into the nose daily., Disp: , Rfl:  .  Flaxseed, Linseed, (FLAX SEEDS PO), Take by mouth daily., Disp: , Rfl:  .  levocetirizine (XYZAL) 5 MG tablet, Take 1 tablet (5 mg total) by mouth daily., Disp: 30 tablet, Rfl: 1 .  Omega-3 Fatty Acids (FISH OIL PO), Take by mouth daily., Disp: , Rfl:  .  omeprazole (PRILOSEC) 40 MG capsule, TAKE ONE CAPSULE BY MOUTH EVERY DAY, Disp: 90 capsule, Rfl: 3 .  predniSONE (DELTASONE) 20 MG tablet, Take 1 tablet (20 mg total) by mouth 2 (two) times daily with a meal., Disp: 10 tablet, Rfl: 0 .  sodium chloride (OCEAN) 0.65 % SOLN nasal spray, Place 1 spray into both nostrils as needed for congestion., Disp: , Rfl:   No Known Allergies  Review of Systems  Constitutional: Negative for fever, chills and weight loss.  HENT: Positive for congestion. Negative for hearing loss, sore throat and tinnitus.   Eyes: Negative for blurred vision, double vision and redness.  Respiratory: Negative for cough, hemoptysis and shortness of breath.   Cardiovascular: Negative for chest pain, palpitations,  orthopnea, claudication and leg swelling.  Gastrointestinal: Negative for heartburn, nausea, vomiting, diarrhea, constipation and blood in stool.  Genitourinary: Negative for dysuria, urgency, frequency and hematuria.  Musculoskeletal: Negative for myalgias, back pain, joint pain, falls and neck pain.  Skin: Negative for itching.  Neurological: Positive for headaches. Negative for dizziness, tingling, tremors, focal weakness, seizures, loss of consciousness and weakness.  Endo/Heme/Allergies: Does not bruise/bleed easily.  Psychiatric/Behavioral: Negative for depression and substance abuse. The patient is not nervous/anxious and does not have insomnia.      Objective  Filed Vitals:   05/18/15 1510  BP: 124/62  Pulse: 71  Temp: 97.9 F (36.6 C)  Resp: 18  Height: 5\' 8"  (1.727 m)  Weight: 196 lb 8 oz (89.132 kg)  SpO2: 98%     Physical Exam  Constitutional: He is oriented to person, place, and time and well-developed, well-nourished, and in no distress.  HENT:  Head: Normocephalic.  Eyes: EOM are normal. Pupils are equal, round, and reactive to light.  Neck: Normal range of motion. Neck supple. No thyromegaly present.  Cardiovascular: Normal rate, regular rhythm and normal heart sounds.   No murmur heard. Pulmonary/Chest: Effort normal and breath sounds normal. No respiratory distress. He has no wheezes.  Abdominal: Soft. Bowel sounds are normal.  Genitourinary: Penis normal. Guaiac negative stool. No discharge found.  Musculoskeletal: Normal  range of motion. He exhibits no edema.  Lymphadenopathy:    He has no cervical adenopathy.  Neurological: He is alert and oriented to person, place, and time. No cranial nerve deficit. Gait normal. Coordination normal.  Skin: Skin is warm and dry. No rash noted.  Psychiatric: Affect and judgment normal.      Assessment & Plan  1. Annual physical exam  - Comprehensive Metabolic Panel (CMET) - Lipid Profile - TSH

## 2015-05-18 NOTE — Patient Instructions (Signed)
Obesity Obesity is defined as having too much total body fat and a body mass index (BMI) of 30 or more. BMI is an estimate of body fat and is calculated from your height and weight. BMI is typically calculated by your health care provider during regular wellness visits. Obesity happens when you consume more calories than you can burn by exercising or performing daily physical tasks. Prolonged obesity can cause major illnesses or emergencies, such as:  Stroke.  Heart disease.  Diabetes.  Cancer.  Arthritis.  High blood pressure (hypertension).  High cholesterol.  Sleep apnea.  Erectile dysfunction.  Infertility problems. CAUSES   Regularly eating unhealthy foods.  Physical inactivity.  Certain disorders, such as an underactive thyroid (hypothyroidism), Cushing's syndrome, and polycystic ovarian syndrome.  Certain medicines, such as steroids, some depression medicines, and antipsychotics.  Genetics.  Lack of sleep. DIAGNOSIS A health care provider can diagnose obesity after calculating your BMI. Obesity will be diagnosed if your BMI is 30 or higher. There are other methods of measuring obesity levels. Some other methods include measuring your skinfold thickness, your waist circumference, and comparing your hip circumference to your waist circumference. TREATMENT  A healthy treatment program includes some or all of the following:  Long-term dietary changes.  Exercise and physical activity.  Behavioral and lifestyle changes.  Medicine only under the supervision of your health care provider. Medicines may help, but only if they are used with diet and exercise programs. If your BMI is 40 or higher, your health care provider may recommend specialized surgery or programs to help with weight loss. An unhealthy treatment program includes:  Fasting.  Fad diets.  Supplements and drugs. These choices do not succeed in long-term weight control. HOME CARE  INSTRUCTIONS  Exercise and perform physical activity as directed by your health care provider. To increase physical activity, try the following:  Use stairs instead of elevators.  Park farther away from store entrances.  Garden, bike, or walk instead of watching television or using the computer.  Eat healthy, low-calorie foods and drinks on a regular basis. Eat more fruits and vegetables. Use low-calorie cookbooks or take healthy cooking classes.  Limit fast food, sweets, and processed snack foods.  Eat smaller portions.  Keep a daily journal of everything you eat. There are many free websites to help you with this. It may be helpful to measure your foods so you can determine if you are eating the correct portion sizes.  Avoid drinking alcohol. Drink more water and drinks without calories.  Take vitamins and supplements only as recommended by your health care provider.  Weight-loss support groups, registered dietitians, counselors, and stress reduction education can also be very helpful. SEEK IMMEDIATE MEDICAL CARE IF:  You have chest pain or tightness.  You have trouble breathing or feel short of breath.  You have weakness or leg numbness.  You feel confused or have trouble talking.  You have sudden changes in your vision.   This information is not intended to replace advice given to you by your health care provider. Make sure you discuss any questions you have with your health care provider.   Document Released: 07/28/2004 Document Revised: 07/11/2014 Document Reviewed: 07/27/2011 Elsevier Interactive Patient Education 2016 [employer]  

## 2015-05-20 LAB — COMPREHENSIVE METABOLIC PANEL
ALT: 35 IU/L (ref 0–44)
AST: 23 IU/L (ref 0–40)
Albumin/Globulin Ratio: 2.1 (ref 1.1–2.5)
Albumin: 4.7 g/dL (ref 3.5–5.5)
Alkaline Phosphatase: 64 IU/L (ref 39–117)
BUN/Creatinine Ratio: 8 — ABNORMAL LOW (ref 9–20)
BUN: 9 mg/dL (ref 6–24)
Bilirubin Total: 0.5 mg/dL (ref 0.0–1.2)
CO2: 26 mmol/L (ref 18–29)
Calcium: 9.9 mg/dL (ref 8.7–10.2)
Chloride: 98 mmol/L (ref 97–106)
Creatinine, Ser: 1.14 mg/dL (ref 0.76–1.27)
GFR calc Af Amer: 91 mL/min/{1.73_m2} (ref >59)
GFR, EST NON AFRICAN AMERICAN: 79 mL/min/{1.73_m2} (ref >59)
Globulin, Total: 2.2 g/dL (ref 1.5–4.5)
Glucose: 101 mg/dL — ABNORMAL HIGH (ref 65–99)
POTASSIUM: 4.9 mmol/L (ref 3.5–5.2)
Sodium: 139 mmol/L (ref 136–144)
Total Protein: 6.9 g/dL (ref 6.0–8.5)

## 2015-05-20 LAB — LIPID PANEL
CHOL/HDL RATIO: 9.2 ratio — AB (ref 0.0–5.0)
Cholesterol, Total: 285 mg/dL — ABNORMAL HIGH (ref 100–199)
HDL: 31 mg/dL — AB (ref >39)
LDL Calculated: 197 mg/dL — ABNORMAL HIGH (ref 0–99)
TRIGLYCERIDES: 283 mg/dL — AB (ref 0–149)
VLDL Cholesterol Cal: 57 mg/dL — ABNORMAL HIGH (ref 5–40)

## 2015-05-20 LAB — TSH: TSH: 1.15 u[IU]/mL (ref 0.450–4.500)

## 2015-05-27 ENCOUNTER — Telehealth: Payer: Self-pay | Admitting: Emergency Medicine

## 2015-05-27 NOTE — Telephone Encounter (Signed)
Spoke to patient regarding labs. Will adjust his diet and exercise until next appointment before starting on medication

## 2015-06-20 ENCOUNTER — Other Ambulatory Visit: Payer: Self-pay | Admitting: Family Medicine

## 2015-08-15 ENCOUNTER — Other Ambulatory Visit: Payer: Self-pay | Admitting: Family Medicine

## 2015-10-29 ENCOUNTER — Other Ambulatory Visit: Payer: Self-pay | Admitting: Family Medicine

## 2015-11-01 ENCOUNTER — Other Ambulatory Visit: Payer: Self-pay | Admitting: Family Medicine

## 2015-11-04 ENCOUNTER — Encounter: Payer: Self-pay | Admitting: Family Medicine

## 2015-11-16 ENCOUNTER — Ambulatory Visit: Payer: BC Managed Care – PPO | Admitting: Family Medicine

## 2015-11-21 IMAGING — CR DG CHEST 2V
1 series · 2 of 2 positions shown · non-contrast
Comparison: None.

CLINICAL DATA: Acute onset of sinus congestion. Tachycardia, with
right-sided neck and shoulder pain. Initial encounter.

EXAM:
CHEST  2 VIEW

[Series 1: w chest pa · 0.14mm/px · 2 of 2 slices shown]
[im 1/2]
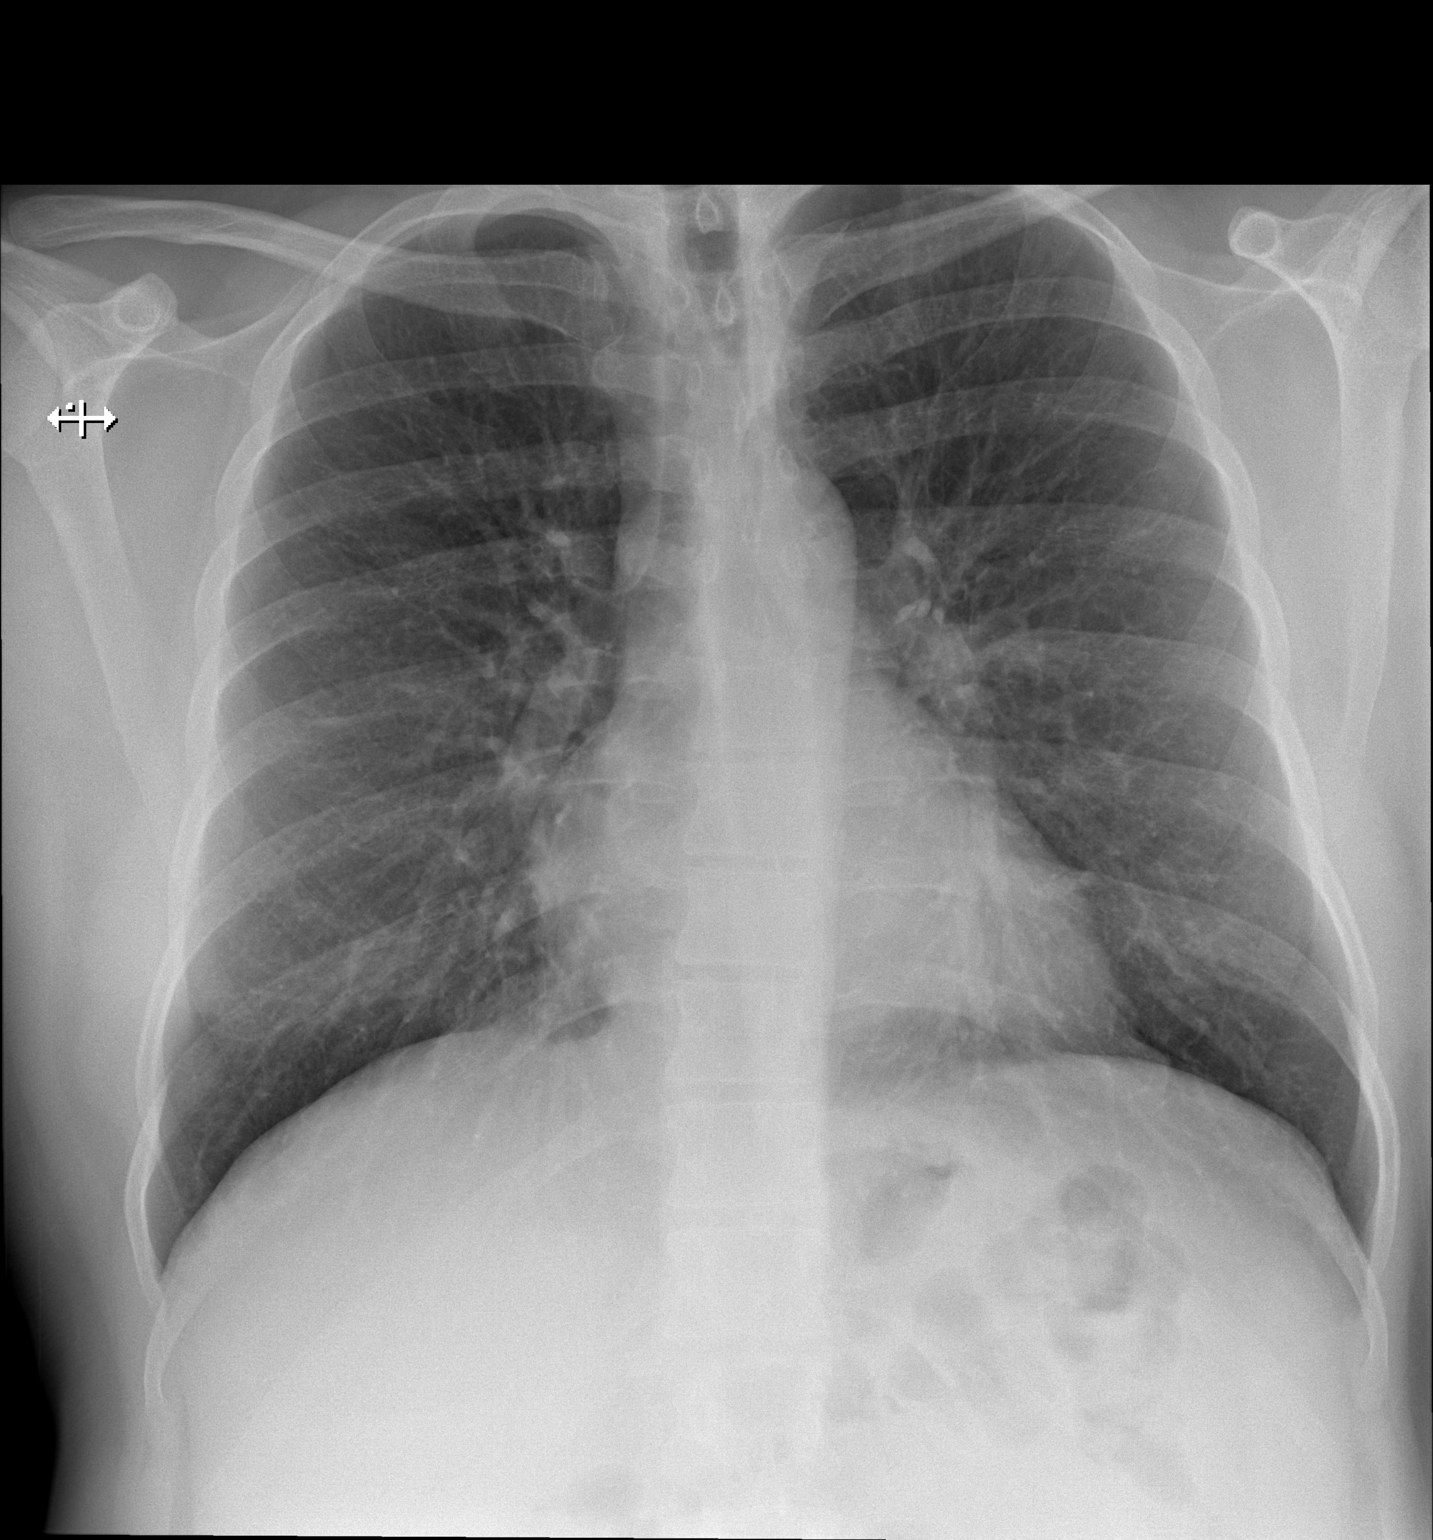
[im 2/2]
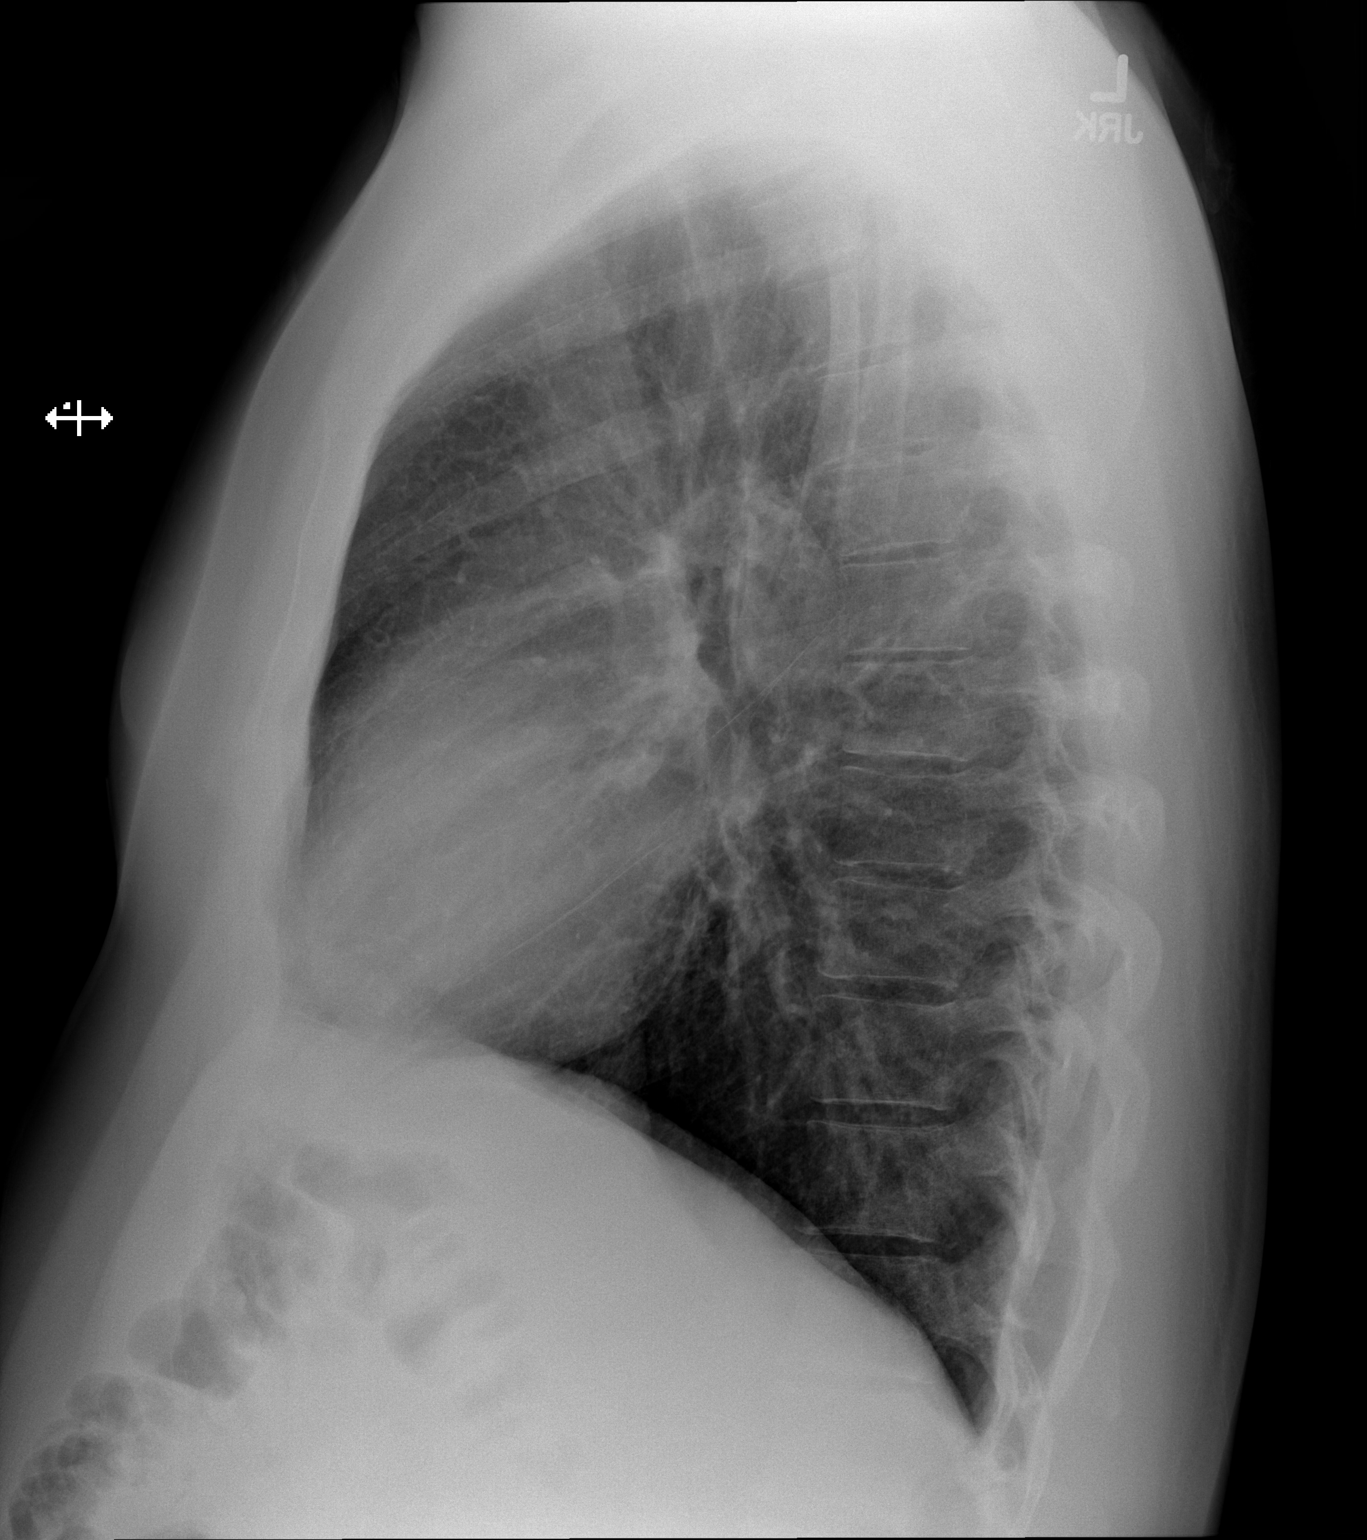

[2 of 2 positions shown; findings below may reference images not displayed]

FINDINGS: The lungs are well-aerated. Mild vascular congestion is noted. There
is no evidence of focal opacification, pleural effusion or
pneumothorax.

The heart is normal in size; the mediastinal contour is within
normal limits. No acute osseous abnormalities are seen.
IMPRESSION: Mild vascular congestion noted; lungs remain grossly clear.

## 2015-12-02 ENCOUNTER — Other Ambulatory Visit: Payer: Self-pay | Admitting: Family Medicine

## 2015-12-26 ENCOUNTER — Other Ambulatory Visit: Payer: Self-pay | Admitting: Family Medicine

## 2015-12-30 ENCOUNTER — Encounter: Payer: Self-pay | Admitting: Family Medicine

## 2015-12-30 MED ORDER — LEVOCETIRIZINE DIHYDROCHLORIDE 5 MG PO TABS: 5.0000 mg | ORAL_TABLET | Freq: Every day | ORAL | Status: DC

## 2015-12-30 MED ORDER — OMEPRAZOLE 40 MG PO CPDR: 40.0000 mg | DELAYED_RELEASE_CAPSULE | Freq: Every day | ORAL | Status: DC | PRN

## 2016-01-26 ENCOUNTER — Other Ambulatory Visit: Payer: Self-pay

## 2016-01-26 NOTE — Telephone Encounter (Signed)
Denied; this drug has risks; I'm not prescribing 90 days without seeing him and discussing risks of prolonged use, if still needed

## 2016-01-26 NOTE — Telephone Encounter (Signed)
Ins wanting 90 day supply 

## 2016-02-23 ENCOUNTER — Other Ambulatory Visit: Payer: Self-pay | Admitting: Family Medicine

## 2016-02-23 NOTE — Telephone Encounter (Signed)
rx sent with cautions

## 2016-03-21 ENCOUNTER — Encounter: Payer: Self-pay | Admitting: Family Medicine

## 2016-03-21 ENCOUNTER — Other Ambulatory Visit: Payer: Self-pay | Admitting: Family Medicine

## 2016-03-21 MED ORDER — RANITIDINE HCL 150 MG PO TABS: 150.0000 mg | ORAL_TABLET | Freq: Two times a day (BID) | ORAL | 0 refills | Status: DC | PRN

## 2016-03-21 NOTE — Telephone Encounter (Signed)
Pt.notified

## 2016-03-21 NOTE — Telephone Encounter (Signed)
Omeprazole is not a drug that should be continued long-term I reviewed his problem list; I do not see Barrett's esophagus I recommend he switch to Zantac 150 mg BID He'll need an appt soon; last visit almost a year ago

## 2016-04-01 ENCOUNTER — Telehealth: Payer: Self-pay

## 2016-04-01 ENCOUNTER — Other Ambulatory Visit: Payer: Self-pay

## 2016-04-01 MED ORDER — RANITIDINE HCL 150 MG PO TABS: 150.0000 mg | ORAL_TABLET | Freq: Two times a day (BID) | ORAL | 0 refills | Status: DC | PRN

## 2016-04-01 NOTE — Telephone Encounter (Signed)
Ins wants 90 day supply? 

## 2016-04-01 NOTE — Telephone Encounter (Signed)
Can you please clarify what they want? Thanks

## 2016-04-01 NOTE — Telephone Encounter (Signed)
rx approved

## 2016-04-23 ENCOUNTER — Other Ambulatory Visit: Payer: Self-pay | Admitting: Family Medicine

## 2016-04-23 NOTE — Telephone Encounter (Signed)
Last note scanned, allergies; upcoming appt; Rx approved

## 2016-05-09 ENCOUNTER — Encounter: Payer: Self-pay | Admitting: Family Medicine

## 2016-05-11 ENCOUNTER — Encounter: Payer: Self-pay | Admitting: Family Medicine

## 2016-05-11 ENCOUNTER — Ambulatory Visit (INDEPENDENT_AMBULATORY_CARE_PROVIDER_SITE_OTHER): Payer: BC Managed Care – PPO | Admitting: Family Medicine

## 2016-05-11 DIAGNOSIS — Z0000 Encounter for general adult medical examination without abnormal findings: Secondary | ICD-10-CM | POA: Diagnosis not present

## 2016-05-11 LAB — CBC WITH DIFFERENTIAL/PLATELET
Basophils Absolute: 52 cells/uL (ref 0–200)
Basophils Relative: 1 %
EOS PCT: 2 %
Eosinophils Absolute: 104 cells/uL (ref 15–500)
HEMATOCRIT: 48.4 % (ref 38.5–50.0)
Hemoglobin: 16.5 g/dL (ref 13.2–17.1)
LYMPHS PCT: 24 %
Lymphs Abs: 1248 cells/uL (ref 850–3900)
MCH: 31 pg (ref 27.0–33.0)
MCHC: 34.1 g/dL (ref 32.0–36.0)
MCV: 91 fL (ref 80.0–100.0)
MPV: 10 fL (ref 7.5–12.5)
Monocytes Absolute: 520 cells/uL (ref 200–950)
Monocytes Relative: 10 %
NEUTROS PCT: 63 %
Neutro Abs: 3276 cells/uL (ref 1500–7800)
Platelets: 196 10*3/uL (ref 140–400)
RBC: 5.32 MIL/uL (ref 4.20–5.80)
RDW: 13.7 % (ref 11.0–15.0)
WBC: 5.2 10*3/uL (ref 3.8–10.8)

## 2016-05-11 LAB — COMPLETE METABOLIC PANEL WITH GFR
ALT: 31 U/L (ref 9–46)
AST: 22 U/L (ref 10–40)
Albumin: 4.7 g/dL (ref 3.6–5.1)
Alkaline Phosphatase: 55 U/L (ref 40–115)
BILIRUBIN TOTAL: 0.8 mg/dL (ref 0.2–1.2)
BUN: 9 mg/dL (ref 7–25)
CO2: 23 mmol/L (ref 20–31)
CREATININE: 1.03 mg/dL (ref 0.60–1.35)
Calcium: 9.8 mg/dL (ref 8.6–10.3)
Chloride: 104 mmol/L (ref 98–110)
GFR, Est Non African American: 89 mL/min (ref >=60)
Glucose, Bld: 92 mg/dL (ref 65–99)
Potassium: 4.6 mmol/L (ref 3.5–5.3)
Sodium: 140 mmol/L (ref 135–146)
TOTAL PROTEIN: 7 g/dL (ref 6.1–8.1)

## 2016-05-11 LAB — LIPID PANEL
CHOLESTEROL: 253 mg/dL — AB (ref <200)
HDL: 35 mg/dL — ABNORMAL LOW (ref >40)
LDL CALC: 174 mg/dL — AB
TRIGLYCERIDES: 218 mg/dL — AB (ref <150)
Total CHOL/HDL Ratio: 7.2 Ratio — ABNORMAL HIGH (ref <5.0)
VLDL: 44 mg/dL — ABNORMAL HIGH (ref <30)

## 2016-05-11 MED ORDER — LEVOCETIRIZINE DIHYDROCHLORIDE 5 MG PO TABS: 5.0000 mg | ORAL_TABLET | Freq: Every day | ORAL | 3 refills | Status: DC | PRN

## 2016-05-11 NOTE — Progress Notes (Signed)
BP 122/74   Pulse 60   Temp 97.8 F (36.6 C) (Oral)   Resp 14   Ht 5\' 7"  (1.702 m)   Wt 190 lb (86.2 kg)   SpO2 99%   BMI 29.76 kg/m    Subjective:    Patient ID: Don Patterson, male    DOB: Nov 24, 1970, 45 y.o.   MRN: [id number]  HPI: Don Patterson is a 45 y.o. male  Chief Complaint  Patient presents with  . Annual Exam    Depression screen [facility] 2/9 05/11/2016 04/01/2015  Decreased Interest 0 0  Down, Depressed, Hopeless 0 0  PHQ - 2 Score 0 0    No flowsheet data found.  Relevant past medical, surgical, family and social history reviewed Past Medical History:  Diagnosis Date  . Allergy   . Colitis   . History of kidney stones   . Migraine   . Sinus congestion    Past Surgical History:  Procedure Laterality Date  . CYST REMOVAL NECK Left 1999   Family History  Problem Relation Age of Onset  . Cancer Mother   . Heart disease Father   . Heart disease Paternal Uncle    Social History  Substance Use Topics  . Smoking status: Never Smoker  . Smokeless tobacco: Never Used  . Alcohol use 0.0 oz/week     Comment: rarely    Interim medical history since last visit reviewed. Allergies and medications reviewed  Review of Systems Per HPI unless specifically indicated above     Objective:    BP 122/74   Pulse 60   Temp 97.8 F (36.6 C) (Oral)   Resp 14   Ht 5\' 7"  (1.702 m)   Wt 190 lb (86.2 kg)   SpO2 99%   BMI 29.76 kg/m   Wt Readings from Last 3 Encounters:  05/11/16 190 lb (86.2 kg)  05/18/15 196 lb 8 oz (89.1 kg)  04/01/15 192 lb 9 oz (87.3 kg)    Physical Exam  Results for orders placed or performed in visit on 05/18/15  Comprehensive Metabolic Panel (CMET)  Result Value Ref Range   Glucose 101 (H) 65 - 99 mg/dL   BUN 9 6 - 24 mg/dL   Creatinine, Ser 1.14 0.76 - 1.27 mg/dL   GFR calc non Af Amer 79 >59 mL/min/1.73   GFR calc Af Amer 91 >59 mL/min/1.73   BUN/Creatinine Ratio 8 (L) 9 - 20   Sodium 139 136 - 144 mmol/L   Potassium 4.9 3.5 - 5.2 mmol/L   Chloride 98 97 - 106 mmol/L   CO2 26 18 - 29 mmol/L   Calcium 9.9 8.7 - 10.2 mg/dL   Total Protein 6.9 6.0 - 8.5 g/dL   Albumin 4.7 3.5 - 5.5 g/dL   Globulin, Total 2.2 1.5 - 4.5 g/dL   Albumin/Globulin Ratio 2.1 1.1 - 2.5   Bilirubin Total 0.5 0.0 - 1.2 mg/dL   Alkaline Phosphatase 64 39 - 117 IU/L   AST 23 0 - 40 IU/L   ALT 35 0 - 44 IU/L  Lipid Profile  Result Value Ref Range   Cholesterol, Total 285 (H) 100 - 199 mg/dL   Triglycerides 283 (H) 0 - 149 mg/dL   HDL 31 (L) >39 mg/dL   VLDL Cholesterol Cal 57 (H) 5 - 40 mg/dL   LDL Calculated 197 (H) 0 - 99 mg/dL   Chol/HDL Ratio 9.2 (H) 0.0 - 5.0 ratio units  TSH  Result Value Ref  Range   TSH 1.150 0.450 - 4.500 uIU/mL      Assessment & Plan:   Problem List Items Addressed This Visit    None       Follow up plan: No Follow-up on file.  An after-visit summary was printed and given to the patient at [place].  Please see the patient instructions which may contain other information and recommendations beyond what is mentioned above in the assessment and plan.  No orders of the defined types were placed in this encounter.   No orders of the defined types were placed in this encounter.

## 2016-05-11 NOTE — Assessment & Plan Note (Signed)
USPSTF grade A and B recommendations reviewed with patient; age-appropriate recommendations, preventive care, screening tests, etc discussed and encouraged; healthy living encouraged; see AVS for patient education given to patient  

## 2016-05-11 NOTE — Progress Notes (Signed)
Patient ID: Don Patterson, male   DOB: Apr 13, 1971, 45 y.o.   MRN: [id number]  Subjective:   Don Patterson is a 45 y.o. male here for a complete physical exam  Interim issues since last visit: none  USPSTF grade A and B recommendations Alcohol: occasional Depression:  Depression screen [facility] 2/9 05/11/2016 04/01/2015  Decreased Interest 0 0  Down, Depressed, Hopeless 0 0  PHQ - 2 Score 0 0   Hypertension: excellent control Obesity: has lost some fat and gained muscle with work-outs, can tell a difference in how he feels Tobacco use: nonsmoker HIV, hep B, hep C: no reason for testing, hep A and B vaccines STD testing and prevention (chl/gon/syphilis): not interested Lipids: came fasting Glucose: came fasting Colorectal cancer: no fam hx Breast cancer: no lumps, mother is a survivor; maternal aunt is okay Lung cancer: n/a Osteoporosis: no prolonged steroids AAA: n/a Aspirin: n/a Diet: he has cut back on certain things, drinking more water, uses fitbit app, low sugar juice with breakfast, unsweetened coconut milk on cereal, coffee with honey, cut sodas majorly out; might eat red meat 1x a week maybe; more chicken or [place] protein, ham occasionally Exercise: working on the treadmill, fitness trailer and clocking back in, 4 days a week Skin cancer: occasional spots, red spots  Past Medical History:  Diagnosis Date  . Allergy   . Colitis   . History of kidney stones   . Migraine   . Sinus congestion    Past Surgical History:  Procedure Laterality Date  . CYST REMOVAL NECK Left 1999   Family History  Problem Relation Age of Onset  . Cancer Mother   . Heart disease Father   . Heart disease Paternal Uncle    Social History  Substance Use Topics  . Smoking status: Never Smoker  . Smokeless tobacco: Never Used  . Alcohol use 0.0 oz/week     Comment: rarely   Review of Systems  Objective:   Vitals:   05/11/16 0817  BP: 122/74  Pulse: 60  Resp: 14  Temp:  97.8 F (36.6 C)  TempSrc: Oral  SpO2: 99%  Weight: 190 lb (86.2 kg)  Height: 5\' 7"  (1.702 m)   Body mass index is 29.76 kg/m. Wt Readings from Last 3 Encounters:  05/11/16 190 lb (86.2 kg)  05/18/15 196 lb 8 oz (89.1 kg)  04/01/15 192 lb 9 oz (87.3 kg)   Physical Exam  Constitutional: He appears well-developed and well-nourished. No distress.  HENT:  Head: Normocephalic and atraumatic.  Nose: Nose normal.  Mouth/Throat: Oropharynx is clear and moist.  Eyes: EOM are normal. No scleral icterus.  Neck: No JVD present. No thyromegaly present.  Cardiovascular: Normal rate, regular rhythm and normal heart sounds.   Pulmonary/Chest: Effort normal and breath sounds normal. No respiratory distress. He has no wheezes. He has no rales.  Abdominal: Soft. Bowel sounds are normal. He exhibits no distension. There is no tenderness. There is no guarding.  Musculoskeletal: Normal range of motion. He exhibits no edema.  Lymphadenopathy:    He has no cervical adenopathy.  Neurological: He is alert. He displays normal reflexes. He exhibits normal muscle tone. Coordination normal.  Skin: Skin is warm and dry. No rash noted. He is not diaphoretic. No erythema. No pallor.  Tiny bright red cherry angiomas scattered on trunk  Psychiatric: He has a normal mood and affect. His behavior is normal. Judgment and thought content normal. His mood appears not anxious. He does not  exhibit a depressed mood.   Assessment/Plan:   Problem List Items Addressed This Visit      Other   Preventative health care    USPSTF grade A and B recommendations reviewed with patient; age-appropriate recommendations, preventive care, screening tests, etc discussed and encouraged; healthy living encouraged; see AVS for patient education given to patient      Relevant Orders   CBC with Differential/Platelet   Lipid panel   COMPLETE METABOLIC PANEL WITH GFR      Meds ordered this encounter  Medications  . levocetirizine  (XYZAL) 5 MG tablet    Sig: Take 1 tablet (5 mg total) by mouth daily as needed for allergies.    Dispense:  90 tablet    Refill:  3   Orders Placed This Encounter  Procedures  . CBC with Differential/Platelet  . Lipid panel  . COMPLETE METABOLIC PANEL WITH GFR    Follow up plan: Return in about 1 year (around 05/11/2017) for complete physical. An after-visit summary was printed and given to the patient at [place].  Please see the patient instructions which may contain other information and recommendations beyond what is mentioned above in the assessment and plan.

## 2016-05-11 NOTE — Patient Instructions (Addendum)
We'll get labs today If you have not heard anything from my staff in a week about any orders/referrals/studies from today, please contact us here to follow-up [phone]  Health Maintenance, Male A healthy lifestyle and preventative care can promote health and wellness.  Maintain regular health, dental, and eye exams.  Eat a healthy diet. Foods like vegetables, fruits, whole grains, low-fat dairy products, and lean protein foods contain the nutrients you need and are low in calories. Decrease your intake of foods high in solid fats, added sugars, and salt. Get information about a proper diet from your health care provider, if necessary.  Regular physical exercise is one of the most important things you can do for your health. Most adults should get at least 150 minutes of moderate-intensity exercise (any activity that increases your heart rate and causes you to sweat) each week. In addition, most adults need muscle-strengthening exercises on 2 or more days a week.   Maintain a healthy weight. The body mass index (BMI) is a screening tool to identify possible weight problems. It provides an estimate of body fat based on height and weight. Your health care provider can find your BMI and can help you achieve or maintain a healthy weight. For males 20 years and older:  A BMI below 18.5 is considered underweight.  A BMI of 18.5 to 24.9 is normal.  A BMI of 25 to 29.9 is considered overweight.  A BMI of 30 and above is considered obese.  Maintain normal blood lipids and cholesterol by exercising and minimizing your intake of saturated fat. Eat a balanced diet with plenty of fruits and vegetables. Blood tests for lipids and cholesterol should begin at age 60 and be repeated every 5 years. If your lipid or cholesterol levels are high, you are over age 63, or you are at high risk for heart disease, you may need your cholesterol levels checked more frequently.Ongoing high lipid and cholesterol  levels should be treated with medicines if diet and exercise are not working.  If you smoke, find out from your health care provider how to quit. If you do not use tobacco, do not start.  Lung cancer screening is recommended for adults aged 63-80 years who are at high risk for developing lung cancer because of a history of smoking. A yearly low-dose CT scan of the lungs is recommended for people who have at least a 30-pack-year history of smoking and are current smokers or have quit within the past 15 years. A pack year of smoking is smoking an average of 1 pack of cigarettes a day for 1 year (for example, a 30-pack-year history of smoking could mean smoking 1 pack a day for 30 years or 2 packs a day for 15 years). Yearly screening should continue until the smoker has stopped smoking for at least 15 years. Yearly screening should be stopped for people who develop a health problem that would prevent them from having lung cancer treatment.  If you choose to drink alcohol, do not have more than 2 drinks per day. One drink is considered to be 12 oz (360 mL) of beer, 5 oz (150 mL) of wine, or 1.5 oz (45 mL) of liquor.  Avoid the use of street drugs. Do not share needles with anyone. Ask for help if you need support or instructions about stopping the use of drugs.  High blood pressure causes heart disease and increases the risk of stroke. High blood pressure is more likely to develop in:  People who have blood pressure in the end of the normal range (100-139/85-89 mm Hg).  People who are overweight or obese.  People who are African American.  If you are 31-59 years of age, have your blood pressure checked every 3-5 years. If you are 16 years of age or older, have your blood pressure checked every year. You should have your blood pressure measured twice--once when you are at a hospital or clinic, and once when you are not at a hospital or clinic. Record the average of the two measurements. To check your  blood pressure when you are not at a hospital or clinic, you can use:  An automated blood pressure machine at a pharmacy.  A home blood pressure monitor.  If you are 52-28 years old, ask your health care provider if you should take aspirin to prevent heart disease.  Diabetes screening involves taking a blood sample to check your fasting blood sugar level. This should be done once every 3 years after age 38 if you are at a normal weight and without risk factors for diabetes. Testing should be considered at a younger age or be carried out more frequently if you are overweight and have at least 1 risk factor for diabetes.  Colorectal cancer can be detected and often prevented. Most routine colorectal cancer screening begins at the age of 38 and continues through age 28. However, your health care provider may recommend screening at an earlier age if you have risk factors for colon cancer. On a yearly basis, your health care provider may provide home test kits to check for hidden blood in the stool. A small camera at the end of a tube may be used to directly examine the colon (sigmoidoscopy or colonoscopy) to detect the earliest forms of colorectal cancer. Talk to your health care provider about this at age 42 when routine screening begins. A direct exam of the colon should be repeated every 5-10 years through age 82, unless early forms of precancerous polyps or small growths are found.  People who are at an increased risk for hepatitis B should be screened for this virus. You are considered at high risk for hepatitis B if:  You were born in a country where hepatitis B occurs often. Talk with your health care provider about which countries are considered high risk.  Your parents were born in a high-risk country and you have not received a shot to protect against hepatitis B (hepatitis B vaccine).  You have HIV or AIDS.  You use needles to inject street drugs.  You live with, or have sex with,  someone who has hepatitis B.  You are a man who has sex with other men (MSM).  You get hemodialysis treatment.  You take certain medicines for conditions like cancer, organ transplantation, and autoimmune conditions.  Hepatitis C blood testing is recommended for all people born from [birth year] through 1963 and any individual with known risk factors for hepatitis C.  Healthy men should no longer receive prostate-specific antigen (PSA) blood tests as part of routine cancer screening. Talk to your health care provider about prostate cancer screening.  Testicular cancer screening is not recommended for adolescents or adult males who have no symptoms. Screening includes self-exam, a health care provider exam, and other screening tests. Consult with your health care provider about any symptoms you have or any concerns you have about testicular cancer.  Practice safe sex. Use condoms and avoid high-risk sexual practices to reduce the spread of  sexually transmitted infections (STIs).  You should be screened for STIs, including gonorrhea and chlamydia if:  You are sexually active and are younger than 24 years.  You are older than 24 years, and your health care provider tells you that you are at risk for this type of infection.  Your sexual activity has changed since you were last screened, and you are at an increased risk for chlamydia or gonorrhea. Ask your health care provider if you are at risk.  If you are at risk of being infected with HIV, it is recommended that you take a prescription medicine daily to prevent HIV infection. This is called pre-exposure prophylaxis (PrEP). You are considered at risk if:  You are a man who has sex with other men (MSM).  You are a heterosexual man who is sexually active with multiple partners.  You take drugs by injection.  You are sexually active with a partner who has HIV.  Talk with your health care provider about whether you are at high risk of being  infected with HIV. If you choose to begin PrEP, you should first be tested for HIV. You should then be tested every 3 months for as long as you are taking PrEP.  Use sunscreen. Apply sunscreen liberally and repeatedly throughout the day. You should seek shade when your shadow is shorter than you. Protect yourself by wearing long sleeves, pants, a wide-brimmed hat, and sunglasses year round whenever you are outdoors.  Tell your health care provider of new moles or changes in moles, especially if there is a change in shape or color. Also, tell your health care provider if a mole is larger than the size of a pencil eraser.  A one-time screening for abdominal aortic aneurysm (AAA) and surgical repair of large AAAs by ultrasound is recommended for men aged 20-75 years who are current or former smokers.  Stay current with your vaccines (immunizations).   This information is not intended to replace advice given to you by your health care provider. Make sure you discuss any questions you have with your health care provider.   Document Released: 12/17/2007 Document Revised: 07/11/2014 Document Reviewed: 11/15/2010 Elsevier Interactive Patient Education [employer].

## 2016-05-17 ENCOUNTER — Encounter: Payer: Self-pay | Admitting: Family Medicine

## 2016-05-17 DIAGNOSIS — E785 Hyperlipidemia, unspecified: Secondary | ICD-10-CM

## 2016-05-17 DIAGNOSIS — Z5181 Encounter for therapeutic drug level monitoring: Secondary | ICD-10-CM

## 2016-05-18 ENCOUNTER — Encounter: Payer: Self-pay | Admitting: Family Medicine

## 2016-05-18 DIAGNOSIS — E785 Hyperlipidemia, unspecified: Secondary | ICD-10-CM

## 2016-05-18 DIAGNOSIS — Z5181 Encounter for therapeutic drug level monitoring: Secondary | ICD-10-CM | POA: Insufficient documentation

## 2016-05-18 HISTORY — DX: Hyperlipidemia, unspecified: E78.5

## 2016-05-18 HISTORY — DX: Encounter for therapeutic drug level monitoring: Z51.81

## 2016-05-18 MED ORDER — ATORVASTATIN CALCIUM 20 MG PO TABS: 20.0000 mg | ORAL_TABLET | Freq: Every day | ORAL | 1 refills | Status: DC

## 2016-05-18 NOTE — Assessment & Plan Note (Signed)
Start statin, recheck labs 6-8 weeks

## 2016-05-18 NOTE — Assessment & Plan Note (Signed)
Check sgpt in 6-8 weeks 

## 2016-06-13 ENCOUNTER — Other Ambulatory Visit: Payer: Self-pay

## 2016-06-13 NOTE — Telephone Encounter (Signed)
Needs 90 day supply 

## 2016-06-13 NOTE — Telephone Encounter (Signed)
90 days requested, but not appropriate; labs due in a few weeks; after labs resulted, will either continue current dose and 90 day supplies will be fine, or dose will be adjusted and rechecked

## 2016-07-21 ENCOUNTER — Other Ambulatory Visit: Payer: Self-pay | Admitting: Family Medicine

## 2016-07-25 ENCOUNTER — Other Ambulatory Visit: Payer: Self-pay | Admitting: Family Medicine

## 2016-07-26 ENCOUNTER — Other Ambulatory Visit: Payer: Self-pay

## 2016-07-26 DIAGNOSIS — E785 Hyperlipidemia, unspecified: Secondary | ICD-10-CM

## 2016-07-26 DIAGNOSIS — Z5181 Encounter for therapeutic drug level monitoring: Secondary | ICD-10-CM

## 2016-07-26 NOTE — Telephone Encounter (Signed)
Please remind patient that we were wanting to recheck his labs 6-8 weeks after he started the atorvastatin 20 mg dose; please ask him to have fasting labs done in the next week; I'll send in refill though; thank you

## 2016-08-01 ENCOUNTER — Encounter: Payer: Self-pay | Admitting: Family Medicine

## 2016-08-23 ENCOUNTER — Other Ambulatory Visit: Payer: Self-pay | Admitting: Family Medicine

## 2016-08-23 LAB — LIPID PANEL
CHOL/HDL RATIO: 3.7 ratio (ref <5.0)
Cholesterol: 173 mg/dL (ref <200)
HDL: 47 mg/dL (ref >40)
LDL Cholesterol: 103 mg/dL — ABNORMAL HIGH (ref <100)
Triglycerides: 117 mg/dL (ref <150)
VLDL: 23 mg/dL (ref <30)

## 2016-08-23 LAB — ALT: ALT: 41 U/L (ref 9–46)

## 2016-08-23 NOTE — Telephone Encounter (Signed)
Pt came in today and had them done. Mention to Pt once the results are back and you review them, We will give him a call.

## 2016-08-23 NOTE — Telephone Encounter (Signed)
Please remind patient to come in ASAP for fasting labs We had hoped to check his lipids and one liver enzyme 6-8 weeks after he started the atorvastatin I'll give a limited amount, but we need to know if any liver problems and if it's the right dose Thank you

## 2016-08-24 ENCOUNTER — Other Ambulatory Visit: Payer: Self-pay | Admitting: Family Medicine

## 2016-08-24 MED ORDER — ATORVASTATIN CALCIUM 20 MG PO TABS: 20.0000 mg | ORAL_TABLET | Freq: Every day | ORAL | 11 refills | Status: DC

## 2016-08-24 NOTE — Progress Notes (Signed)
Labs reviewed Rx sent

## 2016-11-21 ENCOUNTER — Encounter: Payer: Self-pay | Admitting: Family Medicine

## 2016-11-21 ENCOUNTER — Ambulatory Visit (INDEPENDENT_AMBULATORY_CARE_PROVIDER_SITE_OTHER): Payer: BC Managed Care – PPO | Admitting: Family Medicine

## 2016-11-21 VITALS — BP 134/82 | HR 69 | Temp 97.8°F | Resp 14 | Wt 189.5 lb

## 2016-11-21 DIAGNOSIS — R590 Localized enlarged lymph nodes: Secondary | ICD-10-CM | POA: Diagnosis not present

## 2016-11-21 DIAGNOSIS — R1012 Left upper quadrant pain: Secondary | ICD-10-CM | POA: Diagnosis not present

## 2016-11-21 NOTE — Progress Notes (Signed)
BP 134/82   Pulse 69   Temp 97.8 F (36.6 C) (Oral)   Resp 14   Wt 189 lb 8 oz (86 kg)   SpO2 98%   BMI 29.68 kg/m    Subjective:    Patient ID: Don Patterson, male    DOB: 11/06/1970, 46 y.o.   MRN: [id number]  HPI: Don Patterson is a 46 y.o. male  Chief Complaint  Patient presents with  . Sinus Problem    Friday start having fatigue, lymph nodes feel swollen. Sinus pressure on the right side   . 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 2/9 11/21/2016 05/11/2016 04/01/2015  Decreased Interest 0 0 0  Down, Depressed, Hopeless 0 0 0  PHQ - 2 Score 0 0 0   Relevant past medical, surgical, family and social history reviewed Past Medical History:  Diagnosis Date  . Allergy   . Colitis   . History of kidney stones   . Hyperlipidemia LDL goal <130 05/18/2016   Start statin Nov 2017  . Migraine   . Sinus congestion    Past Surgical History:  Procedure Laterality Date  . CYST REMOVAL NECK Left 1999   Family History  Problem Relation Age of Onset  . Cancer Mother   . Heart disease Father   . Heart disease Paternal Uncle    Social History   Social History  . Marital status: Single    Spouse name: N/A  . Number of children: N/A  . Years of education: N/A   Occupational History  . Not on file.   Social History Main Topics  . Smoking status: Never Smoker  .  Smokeless tobacco: Never Used  . Alcohol use 0.0 oz/week     Comment: rarely  . Drug use: No  . Sexual activity: Not on file   Other Topics Concern  . Not on file   Social History Narrative  . No narrative on file   Interim medical history since last visit reviewed. Allergies and medications reviewed  Review of Systems Per HPI unless specifically indicated above     Objective:    BP 134/82   Pulse 69   Temp 97.8 F (36.6 C) (Oral)   Resp 14   Wt 189 lb 8 oz (86 kg)   SpO2 98%   BMI 29.68 kg/m   Wt Readings from Last 3 Encounters:  11/21/16 189 lb 8 oz (86 kg)  05/11/16 190 lb (86.2 kg)  05/18/15 196 lb 8 oz (89.1 kg)    Physical Exam  Constitutional: He appears well-developed and well-nourished. No distress.  Eyes: No scleral icterus.  Cardiovascular: Normal rate and regular rhythm.   Pulmonary/Chest: Effort normal and breath sounds normal.  Abdominal: Soft. Normal appearance and bowel sounds are normal. There is no hepatomegaly. There is  tenderness in the left upper quadrant.  Query palpable spleen tip  Lymphadenopathy:    He has cervical adenopathy.       Right cervical: Posterior cervical adenopathy present.  Neurological: He is alert.  Psychiatric: He has a normal mood and affect.      Assessment & Plan:   Problem List Items Addressed This Visit    None    Visit Diagnoses    Cervical lymphadenopathy    -  Primary   likely viral etiology; discussed ddx; rest, hydration; check labs; to urgent care or ER if worse or red flags   Relevant Orders   COMPLETE METABOLIC PANEL WITH GFR (Completed)   CMV IgM (Completed)   Cytomegalovirus antibody, IgG (Completed)   CBC with Differential/Platelet (Completed)   Left upper quadrant pain       will check CBC and CMP and CMV labs; patient reports hx of mono; I do consider reactivated mono a legit issue; rest, no contact sports   Relevant Orders   COMPLETE METABOLIC PANEL WITH GFR (Completed)   CMV IgM (Completed)    Cytomegalovirus antibody, IgG (Completed)   CBC with Differential/Platelet (Completed)       Follow up plan: No Follow-up on file.  An after-visit summary was printed and given to the patient at [place].  Please see the patient instructions which may contain other information and recommendations beyond what is mentioned above in the assessment and plan.  No orders of the defined types were placed in this encounter.   Orders Placed This Encounter  Procedures  . COMPLETE METABOLIC PANEL WITH GFR  . CMV IgM  . Cytomegalovirus antibody, IgG  . CBC with Differential/Platelet

## 2016-11-21 NOTE — Patient Instructions (Signed)
If you need something for aches or pains, don't use Tylenol (acetaminophen) for now Instead, use non-steroidals (which include Aleve, ibuprofen, Advil, Motrin, and naproxen) Let's get labs today Try vitamin C (orange juice if not diabetic or vitamin C tablets) and drink green tea to help your immune system during your illness Get plenty of rest and hydration Out of work Monday-Wednesday, and return on Thursday (if not, please call me)

## 2016-11-22 LAB — COMPLETE METABOLIC PANEL WITH GFR
ALT: 45 U/L (ref 9–46)
AST: 24 U/L (ref 10–40)
Albumin: 4.7 g/dL (ref 3.6–5.1)
Alkaline Phosphatase: 66 U/L (ref 40–115)
BILIRUBIN TOTAL: 0.8 mg/dL (ref 0.2–1.2)
BUN: 12 mg/dL (ref 7–25)
CO2: 27 mmol/L (ref 20–31)
CREATININE: 1.11 mg/dL (ref 0.60–1.35)
Calcium: 9.7 mg/dL (ref 8.6–10.3)
Chloride: 102 mmol/L (ref 98–110)
GFR, Est African American: >89 mL/min (ref >=60)
GFR, Est Non African American: 81 mL/min (ref >=60)
Glucose, Bld: 84 mg/dL (ref 65–99)
Potassium: 4.4 mmol/L (ref 3.5–5.3)
SODIUM: 139 mmol/L (ref 135–146)
TOTAL PROTEIN: 7 g/dL (ref 6.1–8.1)

## 2016-11-22 LAB — CBC WITH DIFFERENTIAL/PLATELET
BASOS ABS: 60 {cells}/uL (ref 0–200)
Basophils Relative: 1 %
Eosinophils Absolute: 120 cells/uL (ref 15–500)
Eosinophils Relative: 2 %
HEMATOCRIT: 47.6 % (ref 38.5–50.0)
HEMOGLOBIN: 16.1 g/dL (ref 13.2–17.1)
Lymphocytes Relative: 35 %
Lymphs Abs: 2100 cells/uL (ref 850–3900)
MCH: 30.7 pg (ref 27.0–33.0)
MCHC: 33.8 g/dL (ref 32.0–36.0)
MCV: 90.7 fL (ref 80.0–100.0)
MPV: 10.3 fL (ref 7.5–12.5)
Monocytes Absolute: 480 cells/uL (ref 200–950)
Monocytes Relative: 8 %
NEUTROS PCT: 54 %
Neutro Abs: 3240 cells/uL (ref 1500–7800)
Platelets: 196 10*3/uL (ref 140–400)
RBC: 5.25 MIL/uL (ref 4.20–5.80)
RDW: 13.6 % (ref 11.0–15.0)
WBC: 6 10*3/uL (ref 3.8–10.8)

## 2016-11-25 ENCOUNTER — Encounter: Payer: Self-pay | Admitting: Family Medicine

## 2016-11-25 LAB — CMV IGM

## 2016-11-25 LAB — CYTOMEGALOVIRUS ANTIBODY, IGG

## 2017-01-25 ENCOUNTER — Telehealth: Payer: Self-pay | Admitting: Family Medicine

## 2017-01-25 MED ORDER — RANITIDINE HCL 150 MG PO TABS: 150.0000 mg | ORAL_TABLET | Freq: Every evening | ORAL | 1 refills | Status: DC | PRN

## 2017-01-25 NOTE — Telephone Encounter (Signed)
Left detailed vociemail 

## 2017-01-25 NOTE — Telephone Encounter (Signed)
Please let pt know that we got a form from his insurance company They recommend we reduce his ranitidine dose from BID to once a day PRN New Rx sent Avoid triggers Call if worsening sx and we can refer to GI

## 2017-04-17 ENCOUNTER — Other Ambulatory Visit: Payer: Self-pay | Admitting: Family Medicine

## 2017-05-15 ENCOUNTER — Ambulatory Visit (INDEPENDENT_AMBULATORY_CARE_PROVIDER_SITE_OTHER): Payer: BC Managed Care – PPO | Admitting: Family Medicine

## 2017-05-15 ENCOUNTER — Encounter: Payer: Self-pay | Admitting: Family Medicine

## 2017-05-15 DIAGNOSIS — Z0000 Encounter for general adult medical examination without abnormal findings: Secondary | ICD-10-CM

## 2017-05-15 LAB — COMPLETE METABOLIC PANEL WITH GFR
AG Ratio: 2 (calc) (ref 1.0–2.5)
ALT: 43 U/L (ref 9–46)
AST: 24 U/L (ref 10–40)
Albumin: 4.7 g/dL (ref 3.6–5.1)
Alkaline phosphatase (APISO): 63 U/L (ref 40–115)
BILIRUBIN TOTAL: 0.8 mg/dL (ref 0.2–1.2)
BUN: 11 mg/dL (ref 7–25)
CALCIUM: 9.6 mg/dL (ref 8.6–10.3)
CHLORIDE: 102 mmol/L (ref 98–110)
CO2: 29 mmol/L (ref 20–32)
Creat: 1.16 mg/dL (ref 0.60–1.35)
GFR, EST AFRICAN AMERICAN: 88 mL/min/{1.73_m2} (ref >=60)
GFR, EST NON AFRICAN AMERICAN: 76 mL/min/{1.73_m2} (ref >=60)
GLUCOSE: 95 mg/dL (ref 65–99)
Globulin: 2.4 g/dL (calc) (ref 1.9–3.7)
Potassium: 4.1 mmol/L (ref 3.5–5.3)
Sodium: 138 mmol/L (ref 135–146)
TOTAL PROTEIN: 7.1 g/dL (ref 6.1–8.1)

## 2017-05-15 LAB — CBC WITH DIFFERENTIAL/PLATELET
BASOS PCT: 1.5 %
Basophils Absolute: 80 cells/uL (ref 0–200)
Eosinophils Absolute: 133 cells/uL (ref 15–500)
Eosinophils Relative: 2.5 %
HCT: 45.5 % (ref 38.5–50.0)
Hemoglobin: 15.9 g/dL (ref 13.2–17.1)
Lymphs Abs: 1728 cells/uL (ref 850–3900)
MCH: 31.1 pg (ref 27.0–33.0)
MCHC: 34.9 g/dL (ref 32.0–36.0)
MCV: 89 fL (ref 80.0–100.0)
MONOS PCT: 8.8 %
MPV: 10.4 fL (ref 7.5–12.5)
Neutro Abs: 2894 cells/uL (ref 1500–7800)
Neutrophils Relative %: 54.6 %
PLATELETS: 226 10*3/uL (ref 140–400)
RBC: 5.11 10*6/uL (ref 4.20–5.80)
RDW: 12.5 % (ref 11.0–15.0)
TOTAL LYMPHOCYTE: 32.6 %
WBC mixed population: 466 cells/uL (ref 200–950)
WBC: 5.3 10*3/uL (ref 3.8–10.8)

## 2017-05-15 LAB — LIPID PANEL
Cholesterol: 178 mg/dL (ref <200)
HDL: 48 mg/dL (ref >40)
LDL CHOLESTEROL (CALC): 104 mg/dL — AB
Non-HDL Cholesterol (Calc): 130 mg/dL (calc) — ABNORMAL HIGH (ref <130)
TRIGLYCERIDES: 150 mg/dL — AB (ref <150)
Total CHOL/HDL Ratio: 3.7 (calc) (ref <5.0)

## 2017-05-15 LAB — TSH: TSH: 1.16 m[IU]/L (ref 0.40–4.50)

## 2017-05-15 NOTE — Progress Notes (Signed)
Patient ID: Don Patterson, male   DOB: 28-May-1971, 46 y.o.   MRN: [id number]   Subjective:   Don Patterson is a 46 y.o. male here for a complete physical exam  Interim issues since last visit: no medical excitement  USPSTF grade A and B recommendations Depression:   Depression screen [facility] 2/9 05/15/2017 11/21/2016 05/11/2016 04/01/2015  Decreased Interest 0 0 0 0  Down, Depressed, Hopeless - 0 0 0  PHQ - 2 Score 0 0 0 0   Hypertension: BP Readings from Last 3 Encounters:  05/15/17 124/72  11/21/16 134/82  05/11/16 122/74   Obesity: Wt Readings from Last 3 Encounters:  05/15/17 197 lb 11.2 oz (89.7 kg)  11/21/16 189 lb 8 oz (86 kg)  05/11/16 190 lb (86.2 kg)   BMI Readings from Last 3 Encounters:  05/15/17 30.62 kg/m  11/21/16 29.68 kg/m  05/11/16 29.76 kg/m    Skin cancer: no worrisome moles Lung cancer:  n/a Prostate cancer: n/a No results found for: PSA Colorectal cancer: MGF had colon cancer  Aspirin: n/a Diet: pretty good eater; low sugar juice in the morning; banana and probiotic yogurt; Juice Plus vitamins; lots of water Exercise: has a standing desk at work; 3 days a week, 1 mile on a treadmill at 4 mph and free weights; one hour of power flow Christian-based yoga; helps [employer] Alcohol: modest Tobacco use: n/a HIV, hep B, hep C: not interested STD testing and prevention (chl/gon/syphilis): not interested Flu shot at work Tuesday  Lipids:  Lab Results  Component Value Date   CHOL 173 08/23/2016   CHOL 253 (H) 05/11/2016   CHOL 285 (H) 05/19/2015   Lab Results  Component Value Date   HDL 47 08/23/2016   HDL 35 (L) 05/11/2016   HDL 31 (L) 05/19/2015   Lab Results  Component Value Date   LDLCALC 103 (H) 08/23/2016   LDLCALC 174 (H) 05/11/2016   LDLCALC 197 (H) 05/19/2015   Lab Results  Component Value Date   TRIG 117 08/23/2016   TRIG 218 (H) 05/11/2016   TRIG 283 (H) 05/19/2015   Lab Results  Component Value Date   CHOLHDL 3.7  08/23/2016   CHOLHDL 7.2 (H) 05/11/2016   CHOLHDL 9.2 (H) 05/19/2015   No results found for: LDLDIRECT Glucose:  Glucose  Date Value Ref Range Status  05/19/2015 101 (H) 65 - 99 mg/dL Final   Glucose, Bld  Date Value Ref Range Status  11/21/2016 84 65 - 99 mg/dL Final  05/11/2016 92 65 - 99 mg/dL Final  01/19/2015 104 (H) 65 - 99 mg/dL Final     Past Medical History:  Diagnosis Date  . Allergy   . Colitis   . History of kidney stones   . Hyperlipidemia LDL goal <130 05/18/2016   Start statin Nov 2017  . Migraine   . Sinus congestion    Past Surgical History:  Procedure Laterality Date  . CYST REMOVAL NECK Left 1999   Family History  Problem Relation Age of Onset  . Cancer Mother   . Heart disease Father   . Heart disease Paternal Uncle    Social History   Tobacco Use  . Smoking status: Never Smoker  . Smokeless tobacco: Never Used  Substance Use Topics  . Alcohol use: Yes    Alcohol/week: 0.0 oz    Comment: rarely   Review of Systems  HENT: Negative for hearing loss.   Eyes: Negative for visual disturbance.  Respiratory: Negative for  shortness of breath.   Cardiovascular: Negative for chest pain.  Gastrointestinal: Negative for blood in stool.  Genitourinary: Negative for hematuria.    Objective:   Vitals:   05/15/17 0821  BP: 124/72  Pulse: 74  Temp: 97.9 F (36.6 C)  TempSrc: Oral  Weight: 197 lb 11.2 oz (89.7 kg)  Height: 5' 7.38" (1.711 m)   Body mass index is 30.62 kg/m. Wt Readings from Last 3 Encounters:  05/15/17 197 lb 11.2 oz (89.7 kg)  11/21/16 189 lb 8 oz (86 kg)  05/11/16 190 lb (86.2 kg)   Physical Exam  Constitutional: He appears well-developed and well-nourished. No distress.  HENT:  Head: Normocephalic and atraumatic.  Nose: Nose normal.  Mouth/Throat: Oropharynx is clear and moist.  Eyes: EOM are normal. No scleral icterus.  Neck: No JVD present. No thyromegaly present.  Cardiovascular: Normal rate, regular rhythm  and normal heart sounds.  Pulmonary/Chest: Effort normal and breath sounds normal. No respiratory distress. He has no wheezes. He has no rales.  Abdominal: Soft. Bowel sounds are normal. He exhibits no distension. There is no tenderness. There is no guarding.  Musculoskeletal: He exhibits no edema.  Lymphadenopathy:    He has no cervical adenopathy.  Neurological: He is alert. He displays normal reflexes. He exhibits normal muscle tone.  Skin: Skin is warm and dry. No rash noted. He is not diaphoretic. No erythema. No pallor.  Psychiatric: He has a normal mood and affect. His behavior is normal. Judgment and thought content normal.    Assessment/Plan:   Problem List Items Addressed This Visit      Other   Preventative health care    USPSTF grade A and B recommendations reviewed with patient; age-appropriate recommendations, preventive care, screening tests, etc discussed and encouraged; healthy living encouraged; see AVS for patient education given to patient       Relevant Orders   CBC with Differential/Platelet   COMPLETE METABOLIC PANEL WITH GFR   Lipid panel   TSH      Meds ordered this encounter  Medications  . omeprazole (PRILOSEC) 40 MG capsule    Sig: Take 40 mg daily by mouth.   . triamcinolone (NASACORT) 55 MCG/ACT AERO nasal inhaler    Sig: Place 2 sprays daily into the nose.   . GuaiFENesin (MUCINEX PO)    Sig: Take as needed by mouth.   Orders Placed This Encounter  Procedures  . CBC with Differential/Platelet  . COMPLETE METABOLIC PANEL WITH GFR  . Lipid panel  . TSH    Follow up plan: Return in about 1 year (around 05/15/2018) for complete physical.  An After Visit Summary was printed and given to the patient.

## 2017-05-15 NOTE — Assessment & Plan Note (Signed)
USPSTF grade A and B recommendations reviewed with patient; age-appropriate recommendations, preventive care, screening tests, etc discussed and encouraged; healthy living encouraged; see AVS for patient education given to patient  

## 2017-05-15 NOTE — Patient Instructions (Addendum)

## 2017-07-10 ENCOUNTER — Other Ambulatory Visit: Payer: Self-pay

## 2017-07-10 MED ORDER — ATORVASTATIN CALCIUM 20 MG PO TABS
20.0000 mg | ORAL_TABLET | Freq: Every day | ORAL | 3 refills | Status: DC
Start: 2017-07-10 — End: 2018-07-23

## 2017-07-10 NOTE — Telephone Encounter (Signed)
90 day

## 2017-07-10 NOTE — Telephone Encounter (Signed)
Reviewed Nov 2018 labs; Rx approved

## 2017-07-12 ENCOUNTER — Other Ambulatory Visit: Payer: Self-pay | Admitting: Family Medicine

## 2017-07-12 NOTE — Telephone Encounter (Signed)
Patient reported that he is not taking ranitidine in November 2018 Denied Rx (may be automatic refill request)

## 2017-10-25 ENCOUNTER — Encounter: Payer: Self-pay | Admitting: Emergency Medicine

## 2017-10-25 ENCOUNTER — Encounter: Payer: Self-pay | Admitting: Nurse Practitioner

## 2017-10-25 ENCOUNTER — Ambulatory Visit: Payer: BC Managed Care – PPO | Admitting: Nurse Practitioner

## 2017-10-25 VITALS — BP 124/76 | HR 71 | Temp 97.7°F | Resp 18 | Ht 67.0 in | Wt 199.8 lb

## 2017-10-25 DIAGNOSIS — J0101 Acute recurrent maxillary sinusitis: Secondary | ICD-10-CM

## 2017-10-25 MED ORDER — DOXYCYCLINE HYCLATE 100 MG PO TABS: 100.0000 mg | ORAL_TABLET | Freq: Two times a day (BID) | ORAL | 0 refills | Status: DC

## 2017-10-25 NOTE — Patient Instructions (Signed)
-   continue xyzal, netipot, flonase, tylenol as needed  Sinusitis, Adult Sinusitis is soreness and inflammation of your sinuses. Sinuses are hollow spaces in the bones around your face. They are located:  Around your eyes.  In the middle of your forehead.  Behind your nose.  In your cheekbones.  Your sinuses and nasal passages are lined with a stringy fluid (mucus). Mucus normally drains out of your sinuses. When your nasal tissues get inflamed or swollen, the mucus can get trapped or blocked so air cannot flow through your sinuses. This lets bacteria, viruses, and funguses grow, and that leads to infection. Follow these instructions at home: Medicines  Take, use, or apply over-the-counter and prescription medicines only as told by your doctor. These may include nasal sprays.  If you were prescribed an antibiotic medicine, take it as told by your doctor. Do not stop taking the antibiotic even if you start to feel better. Hydrate and Humidify  Drink enough water to keep your pee (urine) clear or pale yellow.  Use a cool mist humidifier to keep the humidity level in your home above 50%.  Breathe in steam for 10-15 minutes, 3-4 times a day or as told by your doctor. You can do this in the bathroom while a hot shower is running.  Try not to spend time in cool or dry air. Rest  Rest as much as possible.  Sleep with your head raised (elevated).  Make sure to get enough sleep each night. General instructions  Put a warm, moist washcloth on your face 3-4 times a day or as told by your doctor. This will help with discomfort.  Wash your hands often with soap and water. If there is no soap and water, use hand sanitizer.  Do not smoke. Avoid being around people who are smoking (secondhand smoke).  Keep all follow-up visits as told by your doctor. This is important. Contact a doctor if:  You have a fever.  Your symptoms get worse.  Your symptoms do not get better within 10  days. Get help right away if:  You have a very bad headache.  You cannot stop throwing up (vomiting).  You have pain or swelling around your face or eyes.  You have trouble seeing.  You feel confused.  Your neck is stiff.  You have trouble breathing. This information is not intended to replace advice given to you by your health care provider. Make sure you discuss any questions you have with your health care provider. Document Released: 12/07/2007 Document Revised: 02/14/2016 Document Reviewed: 04/15/2015 Elsevier Interactive Patient Education  [employer].

## 2017-10-25 NOTE — Progress Notes (Addendum)
Sudafed Name: Don Patterson   MRN: [id number]    DOB: 1970/10/16   Date:10/25/2017       Progress Note  Subjective  Chief Complaint  Chief Complaint  Patient presents with  . Sinusitis    cough, congested, nasal drainage    HPI  Patient presents to clinic with illness ongoing for 5-6days. First symptom was fatigue then facial pressure. Onset was progressive with double worsening. Pt endorses sore throat and tonsils swollen with white exudate this morning on right side. Patient sts has nasal congestion and chronic allergies Self treating with sudafed, neti-pot, liquid mucuous reducer. Alternated with flonase and nasocort, takes xyzal daily, takes tylenol as needed. Endorses right ear fullness and pressure on right side of face and behind eye. Denies dysphagia, shob, myalgias, cp.  Patient Active Problem List   Diagnosis Date Noted  . Hyperlipidemia LDL goal <130 05/18/2016  . Medication monitoring encounter 05/18/2016  . Preventative health care 05/11/2016  . Palpitations 03/29/2015  . Migraine aura without headache 01/14/2015  . Gastroesophageal reflux disease without esophagitis 01/14/2015  . Bilateral shoulder pain 01/14/2015    Past Medical History:  Diagnosis Date  . Allergy   . Colitis   . History of kidney stones   . Hyperlipidemia LDL goal <130 05/18/2016   Start statin Nov 2017  . Migraine   . Sinus congestion     Past Surgical History:  Procedure Laterality Date  . CYST REMOVAL NECK Left 1999    Social History   Tobacco Use  . Smoking status: Never Smoker  . Smokeless tobacco: Never Used  Substance Use Topics  . Alcohol use: Yes    Alcohol/week: 0.0 oz    Comment: rarely     Current Outpatient Medications:  .  atorvastatin (LIPITOR) 20 MG tablet, Take 1 tablet (20 mg total) by mouth at bedtime., Disp: 90 tablet, Rfl: 3 .  Cromolyn Sodium (NASAL ALLERGY NA), Place into the nose daily., Disp: , Rfl:  .  Flaxseed, Linseed, (FLAX SEEDS PO), Take by  mouth daily., Disp: , Rfl:  .  levocetirizine (XYZAL) 5 MG tablet, TAKE 1 TABLET (5 MG TOTAL) BY MOUTH DAILY AS NEEDED FOR ALLERGIES., Disp: 90 tablet, Rfl: 2 .  Omega-3 Fatty Acids (FISH OIL PO), Take by mouth daily., Disp: , Rfl:  .  omeprazole (PRILOSEC) 40 MG capsule, Take 40 mg daily by mouth. , Disp: , Rfl:  .  sodium chloride (OCEAN) 0.65 % SOLN nasal spray, Place 1 spray into both nostrils as needed for congestion., Disp: , Rfl:  .  triamcinolone (NASACORT) 55 MCG/ACT AERO nasal inhaler, Place 2 sprays daily into the nose. , Disp: , Rfl:  .  GuaiFENesin (MUCINEX PO), Take as needed by mouth., Disp: , Rfl:   Allergies  Allergen Reactions  . Cat Hair Extract   . Dust Mite Extract Other (See Comments)  . Tree Extract     ROS   No other specific complaints in a complete review of systems (except as listed in HPI above).  Objective  Vitals:   10/25/17 1015  BP: 124/76  Pulse: 71  Resp: 18  Temp: 97.7 F (36.5 C)  TempSrc: Oral  SpO2: 98%  Weight: 199 lb 12.8 oz (90.6 kg)  Height: 5\' 7"  (1.702 m)    Body mass index is 31.29 kg/m.  Nursing Note and Vital Signs reviewed.  Physical Exam  Constitutional: Patient appears well-developed and well-nourished.  No distress.  HEENT: head atraumatic, normocephalic, pupils equal and  reactive to light, TM's without erythema or bulging,  Right maxillary and frontal sinus tenderness-worse maxilary,no left sided tenderness, neck supple without lymphadenopathy, oropharynx pink and moist without exudate- tonsils 2+ right side- pink, no nasal discharge or swollen turbinates Cardiovascular: Normal rate, regular rhythm, S1/S2 present.  No murmur or rub heard.  Pulmonary/Chest: Effort normal and breath sounds clear. No respiratory distress or retractions. Psychiatric: Patient has a normal mood and affect. behavior is normal. Judgment and thought content normal.  No results found for this or any previous visit (from the past 72  hour(s)).  Assessment & Plan  1. Acute recurrent maxillary sinusitis - continue OTC therapies, rest, hydration and proper nutrition - doxycycline (VIBRA-TABS) 100 MG tablet; Take 1 tablet (100 mg total) by mouth 2 (two) times daily.  Dispense: 20 tablet; Refill: 0  -Red flags and when to present for emergency care or RTC including fever >101.16F, chest pain, shortness of breath, new/worsening/un-resolving symptoms,  reviewed with patient at time of visit.   ------------------------------------------------- I have reviewed this encounter including the documentation in this note and/or discussed this patient with the provider, Suezanne Cheshire DNP AGNP-C. I am certifying that I agree with the content of this note as supervising physician. Enid Derry, [facility] 11/05/2017, 8:22 PM

## 2017-12-31 ENCOUNTER — Other Ambulatory Visit: Payer: Self-pay | Admitting: Family Medicine

## 2018-04-18 ENCOUNTER — Encounter: Payer: Self-pay | Admitting: Family Medicine

## 2018-05-17 ENCOUNTER — Ambulatory Visit (INDEPENDENT_AMBULATORY_CARE_PROVIDER_SITE_OTHER): Payer: BC Managed Care – PPO | Admitting: Family Medicine

## 2018-05-17 ENCOUNTER — Encounter: Payer: Self-pay | Admitting: Family Medicine

## 2018-05-17 VITALS — BP 122/78 | HR 60 | Temp 97.3°F | Ht 67.0 in | Wt 200.2 lb

## 2018-05-17 DIAGNOSIS — E669 Obesity, unspecified: Secondary | ICD-10-CM | POA: Diagnosis not present

## 2018-05-17 DIAGNOSIS — Z23 Encounter for immunization: Secondary | ICD-10-CM | POA: Diagnosis not present

## 2018-05-17 DIAGNOSIS — Z0000 Encounter for general adult medical examination without abnormal findings: Secondary | ICD-10-CM

## 2018-05-17 DIAGNOSIS — E66811 Obesity, class 1: Secondary | ICD-10-CM

## 2018-05-17 DIAGNOSIS — E663 Overweight: Secondary | ICD-10-CM | POA: Insufficient documentation

## 2018-05-17 NOTE — Patient Instructions (Addendum)
We'll get labs today If you have not heard anything from my staff in a week about any orders/referrals/studies from today, please contact us here to follow-up [phone]  Check out the information at [email or web address][email or web address] entitled "Nutrition for Weight Loss: What You Need to Know about Fad Diets" Try to lose between 1-2 pounds per week by taking in fewer calories and burning off more calories You can succeed by limiting portions, limiting foods dense in calories and fat, becoming more active, and drinking 8 glasses of water a day (64 ounces) Don't skip meals, especially breakfast, as skipping meals may alter your metabolism Do not use over-the-counter weight loss pills or gimmicks that claim rapid weight loss A healthy BMI (or body mass index) is between 18.5 and 24.9 You can calculate your ideal BMI at the [place] website ClubMon[employer].fr  Obesity, Adult Obesity is the condition of having too much total body fat. Being overweight or obese means that your weight is greater than what is considered healthy for your body size. Obesity is determined by a measurement called BMI. BMI is an estimate of body fat and is calculated from height and weight. For adults, a BMI of 30 or higher is considered obese. Obesity can eventually lead to other health concerns and major illnesses, including:  Stroke.  Coronary artery disease (CAD).  Type 2 diabetes.  Some types of cancer, including cancers of the colon, breast, uterus, and gallbladder.  Osteoarthritis.  High blood pressure (hypertension).  High cholesterol.  Sleep apnea.  Gallbladder stones.  Infertility problems.  What are the causes? The main cause of obesity is taking in (consuming) more calories than your body uses for energy. Other factors that contribute to this condition may include:  Being born with genes that make you more likely to become obese.  Having a medical condition that  causes obesity. These conditions include: ? Hypothyroidism. ? Polycystic ovarian syndrome (PCOS). ? Binge-eating disorder. ? Cushing syndrome.  Taking certain medicines, such as steroids, antidepressants, and seizure medicines.  Not being physically active (sedentary lifestyle).  Living where there are limited places to exercise safely or buy healthy foods.  Not getting enough sleep.  What increases the risk? The following factors may increase your risk of this condition:  Having a family history of obesity.  Being a woman of African-American descent.  Being a man of Hispanic descent.  What are the signs or symptoms? Having excessive body fat is the main symptom of this condition. How is this diagnosed? This condition may be diagnosed based on:  Your symptoms.  Your medical history.  A physical exam. Your health care provider may measure: ? Your BMI. If you are an adult with a BMI between 25 and less than 30, you are considered overweight. If you are an adult with a BMI of 30 or higher, you are considered obese. ? The distances around your hips and your waist (circumferences). These may be compared to each other to help diagnose your condition. ? Your skinfold thickness. Your health care provider may gently pinch a fold of your skin and measure it.  How is this treated? Treatment for this condition often includes changing your lifestyle. Treatment may include some or all of the following:  Dietary changes. Work with your health care provider and a dietitian to set a weight-loss goal that is healthy and reasonable for you. Dietary changes may include eating: ? Smaller portions. A portion size is the amount of a particular food that is healthy for you  to eat at one time. This varies from person to person. ? Low-calorie or low-fat options. ? More whole grains, fruits, and vegetables.  Regular physical activity. This may include aerobic activity (cardio) and strength  training.  Medicine to help you lose weight. Your health care provider may prescribe medicine if you are unable to lose 1 pound a week after 6 weeks of eating more healthily and doing more physical activity.  Surgery. Surgical options may include gastric banding and gastric bypass. Surgery may be done if: ? Other treatments have not helped to improve your condition. ? You have a BMI of 40 or higher. ? You have life-threatening health problems related to obesity.  Follow these instructions at home:  Eating and drinking   Follow recommendations from your health care provider about what you eat and drink. Your health care provider may advise you to: ? Limit fast foods, sweets, and processed snack foods. ? Choose low-fat options, such as low-fat milk instead of whole milk. ? Eat 5 or more servings of fruits or vegetables every day. ? Eat at home more often. This gives you more control over what you eat. ? Choose healthy foods when you eat out. ? Learn what a healthy portion size is. ? Keep low-fat snacks on hand. ? Avoid sugary drinks, such as soda, fruit juice, iced tea sweetened with sugar, and flavored milk. ? Eat a healthy breakfast.  Drink enough water to keep your urine clear or pale yellow.  Do not go without eating for long periods of time (do not fast) or follow a fad diet. Fasting and fad diets can be unhealthy and even dangerous. Physical Activity  Exercise regularly, as told by your health care provider. Ask your health care provider what types of exercise are safe for you and how often you should exercise.  Warm up and stretch before being active.  Cool down and stretch after being active.  Rest between periods of activity. Lifestyle  Limit the time that you spend in front of your TV, computer, or video game system.  Find ways to reward yourself that do not involve food.  Limit alcohol intake to no more than 1 drink a day for nonpregnant women and 2 drinks a day  for men. One drink equals 12 oz of beer, 5 oz of wine, or 1 oz of hard liquor. General instructions  Keep a weight loss journal to keep track of the food you eat and how much you exercise you get.  Take over-the-counter and prescription medicines only as told by your health care provider.  Take vitamins and supplements only as told by your health care provider.  Consider joining a support group. Your health care provider may be able to recommend a support group.  Keep all follow-up visits as told by your health care provider. This is important. Contact a health care provider if:  You are unable to meet your weight loss goal after 6 weeks of dietary and lifestyle changes. This information is not intended to replace advice given to you by your health care provider. Make sure you discuss any questions you have with your health care provider. Document Released: 07/28/2004 Document Revised: 11/23/2015 Document Reviewed: 04/08/2015 Elsevier Interactive Patient Education  2018 [place].  Preventing Unhealthy [employer], Adult Staying at a healthy weight is important. When fat builds up in your body, you may become overweight or obese. These conditions put you at greater risk for developing certain health problems, such as heart disease,  diabetes, sleeping problems, joint problems, and some cancers. Unhealthy weight gain is often the result of making unhealthy choices in what you eat. It is also a result of not getting enough exercise. You can make changes to your lifestyle to prevent obesity and stay as healthy as possible. What nutrition changes can be made? To maintain a healthy weight and prevent obesity:  Eat only as much as your body needs. To do this: ? Pay attention to signs that you are hungry or full. Stop eating as soon as you feel full. ? If you feel hungry, try drinking water first. Drink enough water so your urine is clear or pale yellow. ? Eat smaller portions. ? Look at  serving sizes on food labels. Most foods contain more than one serving per container. ? Eat the recommended amount of calories for your gender and activity level. While most active people should eat around 2,000 calories per day, if you are trying to lose weight or are not very active, you main need to eat less calories. Talk to your health care provider or dietitian about how many calories you should eat each day.  Choose healthy foods, such as: ? Fruits and vegetables. Try to fill at least half of your plate at each meal with fruits and vegetables. ? Whole grains, such as whole wheat bread, brown rice, and quinoa. ? Lean meats, such as chicken or fish. ? Other healthy proteins, such as beans, eggs, or tofu. ? Healthy fats, such as nuts, seeds, fatty fish, and olive oil. ? Low-fat or fat-free dairy.  Check food labels and avoid food and drinks that: ? Are high in calories. ? Have added sugar. ? Are high in sodium. ? Have saturated fats or trans fats.  Limit how much you eat of the following foods: ? Prepackaged meals. ? Fast food. ? Fried foods. ? Processed meat, such as bacon, sausage, and deli meats. ? Fatty cuts of red meat and poultry with skin.  Cook foods in healthier ways, such as by baking, broiling, or grilling.  When grocery shopping, try to shop around the outside of the store. This helps you buy mostly fresh foods and avoid canned and prepackaged foods.  What lifestyle changes can be made?  Exercise at least 30 minutes 5 or more days each week. Exercising includes brisk walking, yard work, biking, running, swimming, and team sports like basketball and soccer. Ask your health care provider which exercises are safe for you.  Do not use any products that contain nicotine or tobacco, such as cigarettes and e-cigarettes. If you need help quitting, ask your health care provider.  Limit alcohol intake to no more than 1 drink a day for nonpregnant women and 2 drinks a day for  men. One drink equals 12 oz of beer, 5 oz of wine, or 1 oz of hard liquor.  Try to get 7-9 hours of sleep each night. What other changes can be made?  Keep a food and activity journal to keep track of: ? What you ate and how many calories you had. Remember to count sauces, dressings, and side dishes. ? Whether you were active, and what exercises you did. ? Your calorie, weight, and activity goals.  Check your weight regularly. Track any changes. If you notice you have gained weight, make changes to your diet or activity routine.  Avoid taking weight-loss medicines or supplements. Talk to your health care provider before starting any new medicine or supplement.  Talk to your health  care provider before trying any new diet or exercise plan. Why are these changes important? Eating healthy, staying active, and having healthy habits not only help prevent obesity, they also:  Help you to manage stress and emotions.  Help you to connect with friends and family.  Improve your self-esteem.  Improve your sleep.  Prevent long-term health problems.  What can happen if changes are not made? Being obese or overweight can cause you to develop joint or bone problems, which can make it hard for you to stay active or do activities you enjoy. Being obese or overweight also puts stress on your heart and lungs and can lead to health problems like diabetes, heart disease, and some cancers. Where to find more information: Talk with your health care provider or a dietitian about healthy eating and healthy lifestyle choices. You may also find other information through these resources:  [place]. Department of Agriculture MyPlate: FormerBoss.no  American Heart Association: www.heart.org  Centers for Disease Control and Prevention: http://www.wolf.info/  Summary  Staying at a healthy weight is important. It helps prevent certain diseases and health problems, such as heart disease, diabetes, joint problems,  sleep disorders, and some cancers.  Being obese or overweight can cause you to develop joint or bone problems, which can make it hard for you to stay active or do activities you enjoy.  You can prevent unhealthy weight gain by eating a healthy diet, exercising regularly, not smoking, limiting alcohol, and getting enough sleep.  Talk with your health care provider or a dietitian for guidance about healthy eating and healthy lifestyle choices. This information is not intended to replace advice given to you by your health care provider. Make sure you discuss any questions you have with your health care provider. Document Released: 06/21/2016 Document Revised: 07/27/2016 Document Reviewed: 07/27/2016 Elsevier Interactive Patient Education  2018 [place] Maintenance, Male A healthy lifestyle and preventive care is important for your health and wellness. Ask your health care provider about what schedule of regular examinations is right for you. What should I know about weight and diet? Eat a Healthy Diet  Eat plenty of vegetables, fruits, whole grains, low-fat dairy products, and lean protein.  Do not eat a lot of foods high in solid fats, added sugars, or salt.  Maintain a Healthy Weight Regular exercise can help you achieve or maintain a healthy weight. You should:  Do at least 150 minutes of exercise each week. The exercise should increase your heart rate and make you sweat (moderate-intensity exercise).  Do strength-training exercises at least twice a week.  Watch Your Levels of Cholesterol and Blood Lipids  Have your blood tested for lipids and cholesterol every 5 years starting at 45 years of age. If you are at high risk for heart disease, you should start having your blood tested when you are 45 years old. You may need to have your cholesterol levels checked more often if: ? Your lipid or cholesterol levels are high. ? You are older than 45 years of age. ? You are at  high risk for heart disease.  What should I know about cancer screening? Many types of cancers can be detected early and may often be prevented. Lung Cancer  You should be screened every year for lung cancer if: ? You are a current smoker who has smoked for at least 30 years. ? You are a former smoker who has quit within the past 15 years.  Talk to your health care provider  about your screening options, when you should start screening, and how often you should be screened.  Colorectal Cancer  Routine colorectal cancer screening usually begins at 45 years of age and should be repeated every 5-10 years until you are 45 years old. You may need to be screened more often if early forms of precancerous polyps or small growths are found. Your health care provider may recommend screening at an earlier age if you have risk factors for colon cancer.  Your health care provider may recommend using home test kits to check for hidden blood in the stool.  A small camera at the end of a tube can be used to examine your colon (sigmoidoscopy or colonoscopy). This checks for the earliest forms of colorectal cancer.  Prostate and Testicular Cancer  Depending on your age and overall health, your health care provider may do certain tests to screen for prostate and testicular cancer.  Talk to your health care provider about any symptoms or concerns you have about testicular or prostate cancer.  Skin Cancer  Check your skin from head to toe regularly.  Tell your health care provider about any new moles or changes in moles, especially if: ? There is a change in a mole's size, shape, or color. ? You have a mole that is larger than a pencil eraser.  Always use sunscreen. Apply sunscreen liberally and repeat throughout the day.  Protect yourself by wearing long sleeves, pants, a wide-brimmed hat, and sunglasses when outside.  What should I know about heart disease, diabetes, and high blood pressure?  If  you are 42-73 years of age, have your blood pressure checked every 3-5 years. If you are 47 years of age or older, have your blood pressure checked every year. You should have your blood pressure measured twice-once when you are at a hospital or clinic, and once when you are not at a hospital or clinic. Record the average of the two measurements. To check your blood pressure when you are not at a hospital or clinic, you can use: ? An automated blood pressure machine at a pharmacy. ? A home blood pressure monitor.  Talk to your health care provider about your target blood pressure.  If you are between 25-53 years old, ask your health care provider if you should take aspirin to prevent heart disease.  Have regular diabetes screenings by checking your fasting blood sugar level. ? If you are at a normal weight and have a low risk for diabetes, have this test once every three years after the age of 16. ? If you are overweight and have a high risk for diabetes, consider being tested at a younger age or more often.  A one-time screening for abdominal aortic aneurysm (AAA) by ultrasound is recommended for men aged 59-75 years who are current or former smokers. What should I know about preventing infection? Hepatitis B If you have a higher risk for hepatitis B, you should be screened for this virus. Talk with your health care provider to find out if you are at risk for hepatitis B infection. Hepatitis C Blood testing is recommended for:  Everyone born from [birth year] through 1963.  Anyone with known risk factors for hepatitis C.  Sexually Transmitted Diseases (STDs)  You should be screened each year for STDs including gonorrhea and chlamydia if: ? You are sexually active and are younger than 45 years of age. ? You are older than 45 years of age and your health care provider tells  you that you are at risk for this type of infection. ? Your sexual activity has changed since you were last screened and you  are at an increased risk for chlamydia or gonorrhea. Ask your health care provider if you are at risk.  Talk with your health care provider about whether you are at high risk of being infected with HIV. Your health care provider may recommend a prescription medicine to help prevent HIV infection.  What else can I do?  Schedule regular health, dental, and eye exams.  Stay current with your vaccines (immunizations).  Do not use any tobacco products, such as cigarettes, chewing tobacco, and e-cigarettes. If you need help quitting, ask your health care provider.  Limit alcohol intake to no more than 2 drinks per day. One drink equals 12 ounces of beer, 5 ounces of wine, or 1 ounces of hard liquor.  Do not use street drugs.  Do not share needles.  Ask your health care provider for help if you need support or information about quitting drugs.  Tell your health care provider if you often feel depressed.  Tell your health care provider if you have ever been abused or do not feel safe at home. This information is not intended to replace advice given to you by your health care provider. Make sure you discuss any questions you have with your health care provider. Document Released: 12/17/2007 Document Revised: 02/17/2016 Document Reviewed: 03/24/2015 Elsevier Interactive Patient Education  [employer].

## 2018-05-17 NOTE — Progress Notes (Signed)
BP 122/78   Pulse 60   Temp (!) 97.3 F (36.3 C)   Ht 5\' 7"  (1.702 m)   Wt 200 lb 3.2 oz (90.8 kg)   SpO2 98%   BMI 31.36 kg/m    Subjective:    Patient ID: Don Patterson, male    DOB: [birth year], 47 y.o.   MRN: [id number]  HPI: Don Patterson is a 47 y.o. male  Chief Complaint  Patient presents with  . Annual Exam    HPI USPSTF grade A and B recommendations Depression:  Depression screen [facility] 2/9 05/17/2018 05/17/2018 05/15/2017 11/21/2016 05/11/2016  Decreased Interest 0 0 0 0 0  Down, Depressed, Hopeless 0 0 - 0 0  PHQ - 2 Score 0 0 0 0 0  Altered sleeping 0 - - - -  Tired, decreased energy 0 - - - -  Change in appetite 0 - - - -  Feeling bad or failure about yourself  0 - - - -  Trouble concentrating 0 - - - -  Moving slowly or fidgety/restless 0 - - - -  Suicidal thoughts 0 - - - -  PHQ-9 Score 0 - - - -  Difficult doing work/chores Not difficult at all - - - -   Hypertension: BP Readings from Last 3 Encounters:  05/17/18 122/78  10/25/17 124/76  05/15/17 124/72   Obesity: the last couple of weeks have not great for him work-out wise; 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; has been sick recently; does yoga; doing four days at work  [employer] from Last 3 Encounters:  05/17/18 200 lb 3.2 oz (90.8 kg)  10/25/17 199 lb 12.8 oz (90.6 kg)  05/15/17 197 lb 11.2 oz (89.7 kg)   BMI Readings from Last 3 Encounters:  05/17/18 31.36 kg/m  10/25/17 31.29 kg/m  05/15/17 30.62 kg/m    Immunizations: flu shot and tetanus already received Skin cancer: nothing worrisome; spot on chest (c/w DF) Lung cancer:  Nonsmoker essentially Prostate cancer: no one in the family w/ prostate cancer; start screening at 46 No results found for: PSA Colorectal cancer: start at age 69 AAA: n/a Aspirin: n/a Diet: getting fruits and veggies; Juice Plus plant-based veggies, spinach, etc 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            Fasting reference interval .   11/21/2016 84 65 - 99 mg/dL Final  05/11/2016 92 65 - 99 mg/dL Final   Lipids:  Lab Results  Component Value Date   CHOL 178 05/15/2017   CHOL 173 08/23/2016   CHOL 253 (H) 05/11/2016   Lab Results  Component Value Date   HDL 48 05/15/2017   HDL 47 08/23/2016   HDL 35 (L) 05/11/2016   Lab Results  Component Value Date   LDLCALC 104 (H) 05/15/2017   LDLCALC 103 (H) 08/23/2016   LDLCALC 174 (H) 05/11/2016   Lab Results  Component Value Date   TRIG 150 (H) 05/15/2017   TRIG 117 08/23/2016   TRIG 218 (H) 05/11/2016   Lab Results  Component Value Date  CHOLHDL 3.7 05/15/2017   CHOLHDL 3.7 08/23/2016   CHOLHDL 7.2 (H) 05/11/2016   No results found for: LDLDIRECT   Depression screen [facility] 2/9 05/17/2018 05/17/2018 05/15/2017 11/21/2016 05/11/2016  Decreased Interest 0 0 0 0 0  Down, Depressed, Hopeless 0 0 - 0 0  PHQ - 2 Score 0 0 0 0 0  Altered sleeping 0 - - - -  Tired, decreased energy 0 - - - -  Change in appetite 0 - - - -  Feeling bad or failure about yourself  0 - - - -  Trouble concentrating 0 - - - -  Moving slowly or fidgety/restless 0 - - - -  Suicidal thoughts 0 - - - -  PHQ-9 Score 0 - - - -  Difficult doing work/chores Not difficult at all - - - -   Fall Risk  05/17/2018 05/15/2017 11/21/2016 05/11/2016 04/01/2015  Falls in the past year? 0 No No No No    Relevant past medical, surgical, family and social history reviewed Past Medical History:  Diagnosis Date  . Allergy   . Colitis   . History of kidney stones   . Hyperlipidemia LDL goal <130 05/18/2016   Start statin Nov 2017  . Migraine   . Sinus congestion     Past Surgical History:  Procedure Laterality Date  . CYST REMOVAL NECK Left 1999   Family History  Problem Relation Age of Onset  . Diabetes Mother   . Cancer Mother        breast and ovarian  . Heart disease Father   . Heart disease Paternal Uncle    Social History   Tobacco Use  . Smoking status: Never Smoker  . Smokeless tobacco: Never Used  Substance Use Topics  . Alcohol use: Yes    Alcohol/week: 0.0 standard drinks    Comment: rarely  . Drug use: No     Office Visit from 05/17/2018 in [facility]  AUDIT-C Score  1      Interim medical history since last visit reviewed. Allergies and medications reviewed  Review of Systems  Constitutional: Negative for unexpected weight change.  HENT: Positive for sinus pressure. Negative for hearing loss.   Eyes: Negative for visual disturbance.  Respiratory: Negative for cough and shortness of breath.   Cardiovascular: Negative for chest pain.  Gastrointestinal: Negative for blood in stool.  Endocrine: Negative for polydipsia and polyuria.  Genitourinary: Negative for hematuria.  Neurological: Negative for tremors.  Hematological: Negative for adenopathy. Does not bruise/bleed easily.   Per HPI unless specifically indicated above     Objective:    BP 122/78   Pulse 60   Temp (!) 97.3 F (36.3 C)   Ht 5\' 7"  (1.702 m)   Wt 200 lb 3.2 oz (90.8 kg)   SpO2 98%   BMI 31.36 kg/m   Wt Readings from Last 3 Encounters:  05/17/18 200 lb 3.2 oz (90.8 kg)  10/25/17 199 lb 12.8 oz (90.6 kg)  05/15/17 197 lb 11.2 oz (89.7 kg)    Physical Exam  Constitutional: He appears well-developed and well-nourished. No distress.  HENT:  Head: Normocephalic and atraumatic.  Nose: Nose normal.  Mouth/Throat: Oropharynx is clear and moist.  Eyes: EOM are normal. No scleral icterus.  Neck: No JVD present. No thyromegaly present.  Cardiovascular: Normal rate, regular rhythm and normal heart sounds.   Pulmonary/Chest: Effort normal and breath sounds normal. No respiratory distress. He has no wheezes. He has no  rales.  Abdominal: Soft. Bowel sounds are normal. He exhibits no distension. There is no tenderness. There is no guarding.  Musculoskeletal: Normal range of motion. He exhibits no edema.  Lymphadenopathy:    He has no cervical adenopathy.  Neurological: He is alert. He displays normal reflexes. He exhibits normal muscle tone. Coordination normal.  Skin: Skin is warm and dry. No rash noted. He is not diaphoretic. No erythema. No pallor.  Psychiatric: He has a normal mood and affect. His behavior is normal. Judgment and thought content normal.    Results for orders placed or performed in visit on 05/15/17  CBC with Differential/Platelet  Result Value Ref Range   WBC 5.3 3.8 - 10.8 Thousand/uL   RBC 5.11 4.20 - 5.80 Million/uL   Hemoglobin 15.9 13.2 - 17.1 g/dL   HCT 45.5 38.5 - 50.0 %   MCV 89.0 80.0 - 100.0 fL   MCH 31.1 27.0 - 33.0 pg   MCHC 34.9 32.0 - 36.0 g/dL   RDW 12.5 11.0 - 15.0 %   Platelets 226 140 - 400 Thousand/uL   MPV 10.4 7.5 - 12.5 fL   Neutro Abs 2,894 1,500 - 7,800 cells/uL   Lymphs Abs 1,728 850 - 3,900 cells/uL   WBC mixed population 466 200 - 950 cells/uL   Eosinophils Absolute 133 15 - 500 cells/uL   Basophils Absolute 80 0 - 200 cells/uL   Neutrophils Relative % 54.6 %   Total Lymphocyte 32.6 %   Monocytes Relative 8.8 %   Eosinophils Relative 2.5 %   Basophils Relative 1.5 %  COMPLETE METABOLIC PANEL WITH GFR  Result Value Ref Range   Glucose, Bld 95 65 - 99 mg/dL   BUN 11 7 - 25 mg/dL   Creat 1.16 0.60 - 1.35 mg/dL   GFR, Est Non African American 76 > OR = 60 mL/min/1.32m2   GFR, Est African American 88 > OR = 60 mL/min/1.43m2   BUN/Creatinine Ratio NOT APPLICABLE 6 - 22 (calc)   Sodium 138 135 - 146 mmol/L   Potassium 4.1 3.5 - 5.3 mmol/L   Chloride 102 98 - 110 mmol/L   CO2 29 20 - 32 mmol/L   Calcium 9.6 8.6 - 10.3 mg/dL   Total  Protein 7.1 6.1 - 8.1 g/dL   Albumin 4.7 3.6 - 5.1 g/dL   Globulin 2.4 1.9 - 3.7 g/dL (calc)   AG Ratio 2.0 1.0 - 2.5 (calc)   Total Bilirubin 0.8 0.2 - 1.2 mg/dL   Alkaline phosphatase (APISO) 63 40 - 115 U/L   AST 24 10 - 40 U/L   ALT 43 9 - 46 U/L  Lipid panel  Result Value Ref Range   Cholesterol 178 <200 mg/dL   HDL 48 >40 mg/dL   Triglycerides 150 (H) <150 mg/dL   LDL Cholesterol (Calc) 104 (H) mg/dL (calc)   Total CHOL/HDL Ratio 3.7 <5.0 (calc)   Non-HDL Cholesterol (Calc) 130 (H) <130 mg/dL (calc)  TSH  Result Value Ref Range   TSH 1.16 0.40 - 4.50 mIU/L      Assessment & Plan:   Problem List Items Addressed This Visit      Other   Preventative health care - Primary    USPSTF grade A and B recommendations reviewed with patient; age-appropriate recommendations, preventive care, screening tests, etc discussed and encouraged; healthy living encouraged; see AVS for patient education given to patient       Relevant Orders   CBC with Differential/Platelet   COMPLETE  METABOLIC PANEL WITH GFR   Lipid panel   TSH   Obesity (BMI 30.0-34.9)    Encouragement given for weight loss, see AVS; he is active       Other Visit Diagnoses    Need for tetanus, diphtheria, and acellular pertussis (Tdap) vaccine       Relevant Orders   Tdap vaccine greater than or equal to 7yo IM (Completed)   Need for influenza vaccination       Relevant Orders   Flu Vaccine QUAD 6+ mos PF IM (Fluarix Quad PF) (Completed)       Follow up plan: Return in about 1 year (around 05/18/2019) for complete physical.  An after-visit summary was printed and given to the patient at [place].  Please see the patient instructions which may contain other information and recommendations beyond what is mentioned above in the assessment and plan.  No orders of the defined types were placed in this encounter.   Orders Placed This Encounter  Procedures  . Tdap vaccine greater than or equal to 7yo IM  .  Flu Vaccine QUAD 6+ mos PF IM (Fluarix Quad PF)  . CBC with Differential/Platelet  . COMPLETE METABOLIC PANEL WITH GFR  . Lipid panel  . TSH

## 2018-05-17 NOTE — Assessment & Plan Note (Signed)
USPSTF grade A and B recommendations reviewed with patient; age-appropriate recommendations, preventive care, screening tests, etc discussed and encouraged; healthy living encouraged; see AVS for patient education given to patient  

## 2018-05-17 NOTE — Assessment & Plan Note (Signed)
Encouragement given for weight loss, see AVS; he is active

## 2018-05-18 LAB — LIPID PANEL
CHOL/HDL RATIO: 4.1 (calc) (ref <5.0)
Cholesterol: 165 mg/dL (ref <200)
HDL: 40 mg/dL — ABNORMAL LOW (ref >40)
LDL Cholesterol (Calc): 96 mg/dL (calc)
NON-HDL CHOLESTEROL (CALC): 125 mg/dL (ref <130)
TRIGLYCERIDES: 192 mg/dL — AB (ref <150)

## 2018-05-18 LAB — CBC WITH DIFFERENTIAL/PLATELET
Basophils Absolute: 79 cells/uL (ref 0–200)
Basophils Relative: 1.3 %
EOS PCT: 2 %
Eosinophils Absolute: 122 cells/uL (ref 15–500)
HEMATOCRIT: 45.5 % (ref 38.5–50.0)
HEMOGLOBIN: 16 g/dL (ref 13.2–17.1)
LYMPHS ABS: 1976 {cells}/uL (ref 850–3900)
MCH: 31.3 pg (ref 27.0–33.0)
MCHC: 35.2 g/dL (ref 32.0–36.0)
MCV: 89 fL (ref 80.0–100.0)
MPV: 10.6 fL (ref 7.5–12.5)
Monocytes Relative: 7.3 %
NEUTROS ABS: 3477 {cells}/uL (ref 1500–7800)
Neutrophils Relative %: 57 %
Platelets: 248 10*3/uL (ref 140–400)
RBC: 5.11 10*6/uL (ref 4.20–5.80)
RDW: 12.4 % (ref 11.0–15.0)
Total Lymphocyte: 32.4 %
WBC: 6.1 10*3/uL (ref 3.8–10.8)
WBCMIX: 445 {cells}/uL (ref 200–950)

## 2018-05-18 LAB — COMPLETE METABOLIC PANEL WITH GFR
AG Ratio: 1.9 (calc) (ref 1.0–2.5)
ALBUMIN MSPROF: 4.7 g/dL (ref 3.6–5.1)
ALT: 50 U/L — ABNORMAL HIGH (ref 9–46)
AST: 26 U/L (ref 10–40)
Alkaline phosphatase (APISO): 68 U/L (ref 40–115)
BILIRUBIN TOTAL: 0.8 mg/dL (ref 0.2–1.2)
BUN: 11 mg/dL (ref 7–25)
CALCIUM: 9.6 mg/dL (ref 8.6–10.3)
CO2: 29 mmol/L (ref 20–32)
CREATININE: 1.11 mg/dL (ref 0.60–1.35)
Chloride: 102 mmol/L (ref 98–110)
GFR, EST AFRICAN AMERICAN: 92 mL/min/{1.73_m2} (ref >=60)
GFR, EST NON AFRICAN AMERICAN: 80 mL/min/{1.73_m2} (ref >=60)
GLUCOSE: 95 mg/dL (ref 65–99)
Globulin: 2.5 g/dL (calc) (ref 1.9–3.7)
Potassium: 4.3 mmol/L (ref 3.5–5.3)
SODIUM: 139 mmol/L (ref 135–146)
TOTAL PROTEIN: 7.2 g/dL (ref 6.1–8.1)

## 2018-05-18 LAB — TSH: TSH: 1.32 mIU/L (ref 0.40–4.50)

## 2018-05-24 NOTE — Progress Notes (Signed)
Greetings. Your complete blood count is normal. Your glucose is normal. Your kidney function is in the expected range. One liver enzyme is a little elevated, which may be due to fatty liver. I'll encourage you to try a lower fat diet, lose a few pounds, and then let's recheck that in 6-8 weeks (no appointment necessary). Your LDL is under 100 now, which is great. Your thyroid test is normal. Peace, Dr. Sanda Klein

## 2018-07-23 ENCOUNTER — Other Ambulatory Visit: Payer: Self-pay | Admitting: Family Medicine

## 2018-07-23 DIAGNOSIS — R7401 Elevation of levels of liver transaminase levels: Secondary | ICD-10-CM

## 2018-07-23 DIAGNOSIS — R740 Nonspecific elevation of levels of transaminase and lactic acid dehydrogenase [LDH]: Principal | ICD-10-CM

## 2018-07-23 NOTE — Telephone Encounter (Signed)
Patient notified

## 2018-07-23 NOTE — Telephone Encounter (Signed)
Please ask patient to have his liver function tests rechecked I sent him a MyChart message in November about having these redone 6-8 weeks later I'm sending in one week of cholesterol medicine and we'll see him soon for labs (already ordered)

## 2018-07-25 ENCOUNTER — Other Ambulatory Visit: Payer: Self-pay

## 2018-07-25 DIAGNOSIS — R740 Nonspecific elevation of levels of transaminase and lactic acid dehydrogenase [LDH]: Principal | ICD-10-CM

## 2018-07-25 DIAGNOSIS — R7401 Elevation of levels of liver transaminase levels: Secondary | ICD-10-CM

## 2018-07-26 LAB — HEPATIC FUNCTION PANEL
AG RATIO: 1.8 (calc) (ref 1.0–2.5)
ALKALINE PHOSPHATASE (APISO): 62 U/L (ref 40–115)
ALT: 42 U/L (ref 9–46)
AST: 22 U/L (ref 10–40)
Albumin: 4.3 g/dL (ref 3.6–5.1)
Bilirubin, Direct: 0.1 mg/dL (ref 0.0–0.2)
Globulin: 2.4 g/dL (calc) (ref 1.9–3.7)
Indirect Bilirubin: 0.5 mg/dL (calc) (ref 0.2–1.2)
Total Bilirubin: 0.6 mg/dL (ref 0.2–1.2)
Total Protein: 6.7 g/dL (ref 6.1–8.1)

## 2018-08-29 ENCOUNTER — Other Ambulatory Visit: Payer: Self-pay | Admitting: Family Medicine

## 2018-08-29 DIAGNOSIS — R740 Nonspecific elevation of levels of transaminase and lactic acid dehydrogenase [LDH]: Principal | ICD-10-CM

## 2018-08-29 DIAGNOSIS — R7401 Elevation of levels of liver transaminase levels: Secondary | ICD-10-CM

## 2018-11-22 ENCOUNTER — Other Ambulatory Visit: Payer: Self-pay | Admitting: Family Medicine

## 2018-11-22 DIAGNOSIS — R7401 Elevation of levels of liver transaminase levels: Secondary | ICD-10-CM

## 2019-01-13 ENCOUNTER — Other Ambulatory Visit: Payer: Self-pay | Admitting: Family Medicine

## 2019-05-14 ENCOUNTER — Encounter: Payer: Self-pay | Admitting: Family Medicine

## 2019-05-20 ENCOUNTER — Other Ambulatory Visit: Payer: Self-pay

## 2019-05-20 ENCOUNTER — Encounter: Payer: Self-pay | Admitting: Family Medicine

## 2019-05-20 ENCOUNTER — Ambulatory Visit (INDEPENDENT_AMBULATORY_CARE_PROVIDER_SITE_OTHER): Payer: BC Managed Care – PPO | Admitting: Family Medicine

## 2019-05-20 VITALS — BP 122/82 | HR 60 | Temp 97.9°F | Resp 14 | Ht 67.0 in | Wt 212.1 lb

## 2019-05-20 DIAGNOSIS — J309 Allergic rhinitis, unspecified: Secondary | ICD-10-CM

## 2019-05-20 DIAGNOSIS — K219 Gastro-esophageal reflux disease without esophagitis: Secondary | ICD-10-CM

## 2019-05-20 DIAGNOSIS — Z23 Encounter for immunization: Secondary | ICD-10-CM

## 2019-05-20 DIAGNOSIS — Z5181 Encounter for therapeutic drug level monitoring: Secondary | ICD-10-CM

## 2019-05-20 DIAGNOSIS — Z0000 Encounter for general adult medical examination without abnormal findings: Secondary | ICD-10-CM

## 2019-05-20 DIAGNOSIS — E785 Hyperlipidemia, unspecified: Secondary | ICD-10-CM | POA: Diagnosis not present

## 2019-05-20 DIAGNOSIS — E669 Obesity, unspecified: Secondary | ICD-10-CM

## 2019-05-20 LAB — COMPLETE METABOLIC PANEL WITH GFR
AG Ratio: 2.2 (calc) (ref 1.0–2.5)
ALT: 47 U/L — ABNORMAL HIGH (ref 9–46)
AST: 22 U/L (ref 10–40)
Albumin: 4.9 g/dL (ref 3.6–5.1)
Alkaline phosphatase (APISO): 60 U/L (ref 36–130)
BUN: 9 mg/dL (ref 7–25)
CO2: 26 mmol/L (ref 20–32)
Calcium: 9.9 mg/dL (ref 8.6–10.3)
Chloride: 103 mmol/L (ref 98–110)
Creat: 1.09 mg/dL (ref 0.60–1.35)
GFR, Est African American: 94 mL/min/{1.73_m2} (ref >=60)
GFR, Est Non African American: 81 mL/min/{1.73_m2} (ref >=60)
Globulin: 2.2 g/dL (calc) (ref 1.9–3.7)
Glucose, Bld: 91 mg/dL (ref 65–99)
Potassium: 4.2 mmol/L (ref 3.5–5.3)
Sodium: 139 mmol/L (ref 135–146)
Total Bilirubin: 0.9 mg/dL (ref 0.2–1.2)
Total Protein: 7.1 g/dL (ref 6.1–8.1)

## 2019-05-20 LAB — CBC WITH DIFFERENTIAL/PLATELET
Absolute Monocytes: 564 cells/uL (ref 200–950)
Basophils Absolute: 91 cells/uL (ref 0–200)
Basophils Relative: 1.6 %
Eosinophils Absolute: 131 cells/uL (ref 15–500)
Eosinophils Relative: 2.3 %
HCT: 49.2 % (ref 38.5–50.0)
Hemoglobin: 16.3 g/dL (ref 13.2–17.1)
Lymphs Abs: 2138 cells/uL (ref 850–3900)
MCH: 30.1 pg (ref 27.0–33.0)
MCHC: 33.1 g/dL (ref 32.0–36.0)
MCV: 90.8 fL (ref 80.0–100.0)
MPV: 11.5 fL (ref 7.5–12.5)
Monocytes Relative: 9.9 %
Neutro Abs: 2776 cells/uL (ref 1500–7800)
Neutrophils Relative %: 48.7 %
Platelets: 203 10*3/uL (ref 140–400)
RBC: 5.42 10*6/uL (ref 4.20–5.80)
RDW: 12.8 % (ref 11.0–15.0)
Total Lymphocyte: 37.5 %
WBC: 5.7 10*3/uL (ref 3.8–10.8)

## 2019-05-20 LAB — LIPID PANEL
Cholesterol: 182 mg/dL (ref <200)
HDL: 39 mg/dL — ABNORMAL LOW (ref >=40)
LDL Cholesterol (Calc): 110 mg/dL (calc) — ABNORMAL HIGH
Non-HDL Cholesterol (Calc): 143 mg/dL (calc) — ABNORMAL HIGH (ref <130)
Total CHOL/HDL Ratio: 4.7 (calc) (ref <5.0)
Triglycerides: 212 mg/dL — ABNORMAL HIGH (ref <150)

## 2019-05-20 MED ORDER — MONTELUKAST SODIUM 10 MG PO TABS: 10.0000 mg | ORAL_TABLET | Freq: Every day | ORAL | 3 refills | Status: DC

## 2019-05-20 MED ORDER — PANTOPRAZOLE SODIUM 20 MG PO TBEC: 20.0000 mg | DELAYED_RELEASE_TABLET | Freq: Every day | ORAL | 2 refills | Status: DC

## 2019-05-20 MED ORDER — LEVOCETIRIZINE DIHYDROCHLORIDE 5 MG PO TABS: 5.0000 mg | ORAL_TABLET | Freq: Every day | ORAL | 1 refills | Status: DC | PRN

## 2019-05-20 MED ORDER — ATORVASTATIN CALCIUM 20 MG PO TABS: ORAL_TABLET | ORAL | 3 refills | Status: DC

## 2019-05-20 NOTE — Progress Notes (Signed)
Patient: Don Patterson, Male    DOB: January 03, 1971, 48 y.o.   MRN: [id number] Delsa Grana, PA-C Visit Date: 05/20/2019  Today's Provider: Delsa Grana, PA-C   Chief Complaint  Patient presents with  . Annual Exam   Subjective:   Annual physical exam:  Don Patterson is a 48 y.o. male who presents today for health maintenance and annual & complete physical exam.  Switching PCP's after his PCP left the practice earlier this year.  He feels well.  He reports exercising not as much, due to changes in availability in facilities due to [place] pandemic, using elliptical at the house a few times a week, prior to [place] was exercising a few times a week.  Diet - still trying to work on diet for cholesterol He reports he is sleeping fairly well  HLD - on lipitor 20 mg, taking daily for the past two years, still working on diet.  No myaglias.    AR on xyzal and steroid nasal sprays sometimes uses decongestants and mucinex or delsym due to increased seasonal post nasal drip and congestion  GERD- was taking omeprazole about every other day and now using almost daiily, but still having burping and indigestion/GERD. Wanting to try something different or stronger   USPSTF grade A and B recommendations - reviewed and addressed today  Depression:  Phq 9 completed today by patient, was reviewed by me with patient in the room, score is  negative, pt feels good PHQ 2/9 Scores 05/20/2019 05/17/2018 05/17/2018 05/15/2017  PHQ - 2 Score 0 0 0 0  PHQ- 9 Score 0 0 - -   Depression screen Don Patterson 2/9 05/20/2019 05/17/2018 05/17/2018 05/15/2017 11/21/2016  Decreased Interest 0 0 0 0 0  Down, Depressed, Hopeless 0 0 0 - 0  PHQ - 2 Score 0 0 0 0 0  Altered sleeping 0 0 - - -  Tired, decreased energy 0 0 - - -  Change in appetite 0 0 - - -  Feeling bad or failure about yourself  0 0 - - -  Trouble concentrating 0 0 - - -  Moving slowly or fidgety/restless 0 0 - - -  Suicidal thoughts 0 0 - - -   PHQ-9 Score 0 0 - - -  Difficult doing work/chores Not difficult at all Not difficult at all - - -    Hep C Screening: not indicated STD testing and prevention (HIV/chl/gon/syphilis):  Not currently sexually active declines Prostate cancer:   No results found for: PSA Intimate partner violence:  None - lives with family  Urinary Symptoms:  Pt has no concerning urinary sx  Advanced Care Planning:  A voluntary discussion about advance care planning including the explanation and discussion of advance directives.  Discussed health care proxy and Living will, and the patient was able to identify a health care proxy as Don Patterson.  Patient does not have a living will at present time. If patient does have living will, I have requested they bring this to the clinic to be scanned in to their chart.   Skin cancer:  Pt reports no hx of skin cancer, suspicious lesions/biopsies in the past.    Colorectal cancer:  colonoscopy - pt ok to wait till 48 y/o    Lung cancer:   Low Dose CT Chest recommended if Age 57-80 years, 30 pack-year currently smoking OR have quit w/in 15years. Patient does not qualify.   Social History   Tobacco Use  . Smoking  status: Never Smoker  . Smokeless tobacco: Never Used  Substance Use Topics  . Alcohol use: Yes    Alcohol/week: 0.0 standard drinks    Comment: rarely     Alcohol screening:   Office Visit from 05/20/2019 in [facility]  AUDIT-C Score  1      AAA: not indicated The USPSTF recommends one-time screening with ultrasonography in men ages 43 to 17 years who have ever smoked  ECG:not done today, has ECG's done in the past  Blood pressure/Hypertension: BP Readings from Last 3 Encounters:  05/20/19 122/82  05/17/18 122/78  10/25/17 124/76   Weight/Obesity: Wt Readings from Last 3 Encounters:  05/20/19 212 lb 1.6 oz (96.2 kg)  05/17/18 200 lb 3.2 oz (90.8 kg)  10/25/17 199 lb 12.8 oz (90.6 kg)   BMI Readings from Last 3  Encounters:  05/20/19 33.22 kg/m  05/17/18 31.36 kg/m  10/25/17 31.29 kg/m    Lipids:  Lab Results  Component Value Date   CHOL 165 05/17/2018   CHOL 178 05/15/2017   CHOL 173 08/23/2016   Lab Results  Component Value Date   HDL 40 (L) 05/17/2018   HDL 48 05/15/2017   HDL 47 08/23/2016   Lab Results  Component Value Date   LDLCALC 96 05/17/2018   LDLCALC 104 (H) 05/15/2017   LDLCALC 103 (H) 08/23/2016   Lab Results  Component Value Date   TRIG 192 (H) 05/17/2018   TRIG 150 (H) 05/15/2017   TRIG 117 08/23/2016   Lab Results  Component Value Date   CHOLHDL 4.1 05/17/2018   CHOLHDL 3.7 05/15/2017   CHOLHDL 3.7 08/23/2016   No results found for: LDLDIRECT Based on the results of lipid panel his/her cardiovascular risk factor ( using [employer] )  in the next 10 years is : The 10-year ASCVD risk score Don Patterson DC Don Patterson., et al., 2013) is: 2%   Values used to calculate the score:     Age: 83 years     Sex: Male     Is Non-Hispanic African American: No     Diabetic: No     Tobacco smoker: No     Systolic Blood Pressure: 568 mmHg     Is BP treated: No     HDL Cholesterol: 40 mg/dL     Total Cholesterol: 165 mg/dL Glucose:  Glucose, Bld  Date Value Ref Range Status  05/17/2018 95 65 - 99 mg/dL Final    Comment:    .            Fasting reference interval .   05/15/2017 95 65 - 99 mg/dL Final    Comment:    .            Fasting reference interval .   11/21/2016 84 65 - 99 mg/dL Final    Social History      He  reports that he has never smoked. He has never used smokeless tobacco. He reports current alcohol use. He reports that he does not use drugs.       Social History   Socioeconomic History  . Marital status: Single    Spouse name: Not on file  . Number of children: Not on file  . Years of education: 69  . Highest education level: Bachelor's degree (e.g., BA, AB, BS)  Occupational History  . Not on file  Social Needs  . Financial resource  strain: Not hard at all  . Food insecurity  Worry: Never true    Inability: Never true  . Transportation needs    Medical: No    Non-medical: No  Tobacco Use  . Smoking status: Never Smoker  . Smokeless tobacco: Never Used  Substance and Sexual Activity  . Alcohol use: Yes    Alcohol/week: 0.0 standard drinks    Comment: rarely  . Drug use: No  . Sexual activity: Not Currently  Lifestyle  . Physical activity    Days per week: 4 days    Minutes per session: 30 min  . Stress: To some extent  Relationships  . Social Herbalist on phone: Three times a week    Gets together: Once a week    Attends religious service: More than 4 times per year    Active member of club or organization: Yes    Attends meetings of clubs or organizations: More than 4 times per year    Relationship status: Never married  Other Topics Concern  . Not on file  Social History Narrative  . Not on file         Social History   Socioeconomic History  . Marital status: Single    Spouse name: Not on file  . Number of children: Not on file  . Years of education: 47  . Highest education level: Bachelor's degree (e.g., BA, AB, BS)  Occupational History  . Not on file  Social Needs  . Financial resource strain: Not hard at all  . Food insecurity    Worry: Never true    Inability: Never true  . Transportation needs    Medical: No    Non-medical: No  Tobacco Use  . Smoking status: Never Smoker  . Smokeless tobacco: Never Used  Substance and Sexual Activity  . Alcohol use: Yes    Alcohol/week: 0.0 standard drinks    Comment: rarely  . Drug use: No  . Sexual activity: Not Currently  Lifestyle  . Physical activity    Days per week: 4 days    Minutes per session: 30 min  . Stress: To some extent  Relationships  . Social Herbalist on phone: Three times a week    Gets together: Once a week    Attends religious service: More than 4 times per year    Active member of club  or organization: Yes    Attends meetings of clubs or organizations: More than 4 times per year    Relationship status: Never married  . Intimate partner violence    Fear of current or ex partner: No    Emotionally abused: No    Physically abused: No    Forced sexual activity: No  Other Topics Concern  . Not on file  Social History Narrative  . Not on file    Family History        Family Status  Relation Name Status  . Mother  Alive  . Father  Alive  . Annamarie Major  Alive        His family history includes Cancer in his mother; Diabetes in his mother; Heart disease in his father and paternal uncle.       Family History  Problem Relation Age of Onset  . Diabetes Mother   . Cancer Mother        breast and ovarian  . Heart disease Father   . Heart disease Paternal Uncle     Patient Active Problem List  Diagnosis Date Noted  . Obesity (BMI 30.0-34.9) 05/17/2018  . Hyperlipidemia LDL goal <130 05/18/2016  . Migraine aura without headache 01/14/2015  . Gastroesophageal reflux disease without esophagitis 01/14/2015    Past Surgical History:  Procedure Laterality Date  . CYST REMOVAL NECK Left 1999     Current Outpatient Medications:  .  atorvastatin (LIPITOR) 20 MG tablet, TAKE 1 TABLET BY MOUTH EVERYDAY AT BEDTIME, Disp: 90 tablet, Rfl: 3 .  Flaxseed, Linseed, (FLAX SEEDS PO), Take by mouth daily., Disp: , Rfl:  .  GuaiFENesin (MUCINEX PO), Take as needed by mouth., Disp: , Rfl:  .  levocetirizine (XYZAL) 5 MG tablet, Take 1 tablet (5 mg total) by mouth daily as needed for allergies., Disp: 90 tablet, Rfl: 1 .  Omega-3 Fatty Acids (FISH OIL PO), Take by mouth daily., Disp: , Rfl:  .  sodium chloride (OCEAN) 0.65 % SOLN nasal spray, Place 1 spray into both nostrils as needed for congestion., Disp: , Rfl:  .  triamcinolone (NASACORT) 55 MCG/ACT AERO nasal inhaler, Place 2 sprays daily into the nose. , Disp: , Rfl:  .  Cromolyn Sodium (NASAL ALLERGY NA), Place into the nose  daily., Disp: , Rfl:  .  montelukast (SINGULAIR) 10 MG tablet, Take 1 tablet (10 mg total) by mouth at bedtime., Disp: 30 tablet, Rfl: 3 .  pantoprazole (PROTONIX) 20 MG tablet, Take 1 tablet (20 mg total) by mouth daily., Disp: 30 tablet, Rfl: 2  Allergies  Allergen Reactions  . Cat Hair Extract   . Dust Mite Extract Other (See Comments)  . Tree Extract     Patient Care Team: Delsa Grana, PA-C as PCP - General (Family Medicine)  I personally reviewed active problem list, medication list, allergies, family history, social history, health maintenance, notes from last encounter, lab results, imaging with the patient/caregiver today.  Review of Systems  Constitutional: Negative.  Negative for activity change, appetite change, fatigue and unexpected weight change.  HENT: Positive for postnasal drip.   Eyes: Negative.   Respiratory: Negative.  Negative for shortness of breath.   Cardiovascular: Negative.  Negative for chest pain, palpitations and leg swelling.  Gastrointestinal: Negative.  Negative for abdominal pain, anal bleeding, blood in stool, constipation, diarrhea, nausea and vomiting.  Endocrine: Negative.   Genitourinary: Negative.  Negative for decreased urine volume, difficulty urinating, testicular pain and urgency.  Musculoskeletal: Negative.  Negative for arthralgias and myalgias.  Skin: Negative.  Negative for color change and pallor.  Allergic/Immunologic: Negative.   Neurological: Negative.  Negative for syncope, weakness, light-headedness and numbness.  Psychiatric/Behavioral: Negative.  Negative for confusion, dysphoric mood, self-injury and suicidal ideas. The patient is not nervous/anxious.   All other systems reviewed and are negative.         Objective:   Vitals:  Vitals:   05/20/19 0811  BP: 122/82  Pulse: 60  Resp: 14  Temp: 97.9 F (36.6 C)  SpO2: 99%  Weight: 212 lb 1.6 oz (96.2 kg)  Height: '5\' 7"'$  (1.702 m)    Body mass index is 33.22 kg/m.   Physical Exam Vitals signs and nursing note reviewed.  Constitutional:      General: He is not in acute distress.    Appearance: Normal appearance. He is well-developed. He is obese. He is not ill-appearing, toxic-appearing or diaphoretic.  HENT:     Head: Normocephalic and atraumatic.     Jaw: No trismus.     Right Ear: Tympanic membrane, ear canal and external ear normal.  Left Ear: Tympanic membrane, ear canal and external ear normal.     Nose: Septal deviation, mucosal edema, congestion and rhinorrhea present.     Right Turbinates: Enlarged.     Left Turbinates: Enlarged.     Right Sinus: No maxillary sinus tenderness or frontal sinus tenderness.     Left Sinus: No maxillary sinus tenderness or frontal sinus tenderness.     Comments: Erythematous nasal mucosa    Mouth/Throat:     Mouth: Mucous membranes are moist.     Pharynx: Uvula midline. Posterior oropharyngeal erythema present. No oropharyngeal exudate or uvula swelling.  Eyes:     General: Lids are normal.     Conjunctiva/sclera: Conjunctivae normal.     Pupils: Pupils are equal, round, and reactive to light.  Neck:     Musculoskeletal: Normal range of motion and neck supple.     Trachea: Trachea and phonation normal. No tracheal deviation.  Cardiovascular:     Rate and Rhythm: Regular rhythm.     Pulses: Normal pulses.          Radial pulses are 2+ on the right side and 2+ on the left side.       Posterior tibial pulses are 2+ on the right side and 2+ on the left side.     Heart sounds: Normal heart sounds. No murmur. No friction rub. No gallop.   Pulmonary:     Effort: Pulmonary effort is normal.     Breath sounds: Normal breath sounds. No wheezing, rhonchi or rales.  Abdominal:     General: Bowel sounds are normal. There is no distension.     Palpations: Abdomen is soft.     Tenderness: There is no abdominal tenderness. There is no guarding or rebound.  Musculoskeletal: Normal range of motion.  Skin:     General: Skin is warm and dry.     Capillary Refill: Capillary refill takes less than 2 seconds.     Findings: No rash.  Neurological:     Mental Status: He is alert and oriented to person, place, and time.     Gait: Gait normal.  Psychiatric:        Speech: Speech normal.        Behavior: Behavior normal.       PHQ2/9: Depression screen [facility] 2/9 05/20/2019 05/17/2018 05/17/2018 05/15/2017 11/21/2016  Decreased Interest 0 0 0 0 0  Down, Depressed, Hopeless 0 0 0 - 0  PHQ - 2 Score 0 0 0 0 0  Altered sleeping 0 0 - - -  Tired, decreased energy 0 0 - - -  Change in appetite 0 0 - - -  Feeling bad or failure about yourself  0 0 - - -  Trouble concentrating 0 0 - - -  Moving slowly or fidgety/restless 0 0 - - -  Suicidal thoughts 0 0 - - -  PHQ-9 Score 0 0 - - -  Difficult doing work/chores Not difficult at all Not difficult at all - - -    Fall Risk: Fall Risk  05/20/2019 05/17/2018 05/15/2017 11/21/2016 05/11/2016  Falls in the past year? 0 0 No No No  Number falls in past yr: 0 - - - -  Injury with Fall? 0 - - - -    Functional Status Survey: Is the patient deaf or have difficulty hearing?: No Does the patient have difficulty seeing, even when wearing glasses/contacts?: No Does the patient have difficulty concentrating, remembering, or making decisions?: No Does  the patient have difficulty walking or climbing stairs?: No Does the patient have difficulty dressing or bathing?: No Does the patient have difficulty doing errands alone such as visiting a doctor's office or shopping?: No   Assessment & Plan:    CPE completed today  . Prostate cancer screening and PSA options (with potential risks and benefits of testing vs not testing) were discussed along with recent recs/guidelines, shared decision making and handout/information given to pt today  . USPSTF grade A and B recommendations reviewed with patient; age-appropriate recommendations, preventive care, screening tests,  etc discussed and encouraged; healthy living encouraged; see AVS for patient education given to patient  . Discussed importance of 150 minutes of physical activity weekly, AHA exercise recommendations given to pt in AVS/handout  . Discussed importance of healthy diet:  eating lean meats and proteins, avoiding trans fats and saturated fats, avoid simple sugars and excessive carbs in diet, eat 6 servings of fruit/vegetables daily and drink plenty of water and avoid sweet beverages.  DASH diet reviewed if pt has HTN  . Recommended pt to do annual eye exam and routine dental exams/cleanings  . Reviewed Health Maintenance: Health Maintenance  Topic Date Due  . INFLUENZA VACCINE  02/02/2019  . HIV Screening  05/19/2020 (Originally 05/14/1988)  . TETANUS/TDAP  05/17/2028    . Immunizations: Immunization History  Administered Date(s) Administered  . Hepatitis B 08/06/2007  . IPV 08/06/2007  . Influenza,inj,Quad PF,6+ Mos 05/17/2018, 05/20/2019  . Influenza-Unspecified 05/10/2016, 05/09/2017, 04/07/2018  . MMR 09/25/2009  . Tdap 08/06/2007, 05/17/2018      ICD-10-CM   1. Adult general medical exam  Z61.09 COMPLETE METABOLIC PANEL WITH GFR    Lipid panel    CBC with Differential/Platelet  2. Hyperlipidemia, unspecified hyperlipidemia type  U04.5 COMPLETE METABOLIC PANEL WITH GFR    Lipid panel    atorvastatin (LIPITOR) 20 MG tablet   tolerating lipitor w/o concerns or SE, rechecking labs today, past LFT elevated  3. Gastroesophageal reflux disease, unspecified whether esophagitis present  K21.9 pantoprazole (PROTONIX) 20 MG tablet   d/c omeprazole and start protonix  4. Allergic rhinitis, unspecified seasonality, unspecified trigger  J30.9 montelukast (SINGULAIR) 10 MG tablet    levocetirizine (XYZAL) 5 MG tablet   still very symptomatic/uncontrolled, discussed increasing or switching antihistamines, adding singulair- discussed black box warning  5. Obesity (BMI 30.0-34.9)  E66.9   6.  Medication monitoring encounter  W09.81 COMPLETE METABOLIC PANEL WITH GFR    Lipid panel    CBC with Differential/Platelet  7. Need for influenza vaccination  Z23 Flu Vaccine QUAD 6+ mos PF IM (Fluarix Quad PF)      Delsa Grana, PA-C 05/20/19 8:51 AM  [facility]

## 2019-05-20 NOTE — Patient Instructions (Signed)
Preventive Care 40-46 Years Old, Male Preventive care refers to lifestyle choices and visits with your health care provider that can promote health and wellness. This includes:  A yearly physical exam. This is also called an annual well check.  Regular dental and eye exams.  Immunizations.  Screening for certain conditions.  Healthy lifestyle choices, such as eating a healthy diet, getting regular exercise, not using drugs or products that contain nicotine and tobacco, and limiting alcohol use. What can I expect for my preventive care visit? Physical exam Your health care provider will check:  Height and weight. These may be used to calculate body mass index (BMI), which is a measurement that tells if you are at a healthy weight.  Heart rate and blood pressure.  Your skin for abnormal spots. Counseling Your health care provider may ask you questions about:  Alcohol, tobacco, and drug use.  Emotional well-being.  Home and relationship well-being.  Sexual activity.  Eating habits.  Work and work environment. What immunizations do I need?  Influenza (flu) vaccine  This is recommended every year. Tetanus, diphtheria, and pertussis (Tdap) vaccine  You may need a Td booster every 10 years. Varicella (chickenpox) vaccine  You may need this vaccine if you have not already been vaccinated. Zoster (shingles) vaccine  You may need this after age 60. Measles, mumps, and rubella (MMR) vaccine  You may need at least one dose of MMR if you were born in 1957 or later. You may also need a second dose. Pneumococcal conjugate (PCV13) vaccine  You may need this if you have certain conditions and were not previously vaccinated. Pneumococcal polysaccharide (PPSV23) vaccine  You may need one or two doses if you smoke cigarettes or if you have certain conditions. Meningococcal conjugate (MenACWY) vaccine  You may need this if you have certain conditions. Hepatitis A vaccine   You may need this if you have certain conditions or if you travel or work in places where you may be exposed to hepatitis A. Hepatitis B vaccine  You may need this if you have certain conditions or if you travel or work in places where you may be exposed to hepatitis B. Haemophilus influenzae type b (Hib) vaccine  You may need this if you have certain risk factors. Human papillomavirus (HPV) vaccine  If recommended by your health care provider, you may need three doses over 6 months. You may receive vaccines as individual doses or as more than one vaccine together in one shot (combination vaccines). Talk with your health care provider about the risks and benefits of combination vaccines. What tests do I need? Blood tests  Lipid and cholesterol levels. These may be checked every 5 years, or more frequently if you are over 50 years old.  Hepatitis C test.  Hepatitis B test. Screening  Lung cancer screening. You may have this screening every year starting at age 55 if you have a 30-pack-year history of smoking and currently smoke or have quit within the past 15 years.  Prostate cancer screening. Recommendations will vary depending on your family history and other risks.  Colorectal cancer screening. All adults should have this screening starting at age 50 and continuing until age 75. Your health care provider may recommend screening at age 45 if you are at increased risk. You will have tests every 1-10 years, depending on your results and the type of screening test.  Diabetes screening. This is done by checking your blood sugar (glucose) after you have not eaten   for a while (fasting). You may have this done every 1-3 years.  Sexually transmitted disease (STD) testing. Follow these instructions at home: Eating and drinking  Eat a diet that includes fresh fruits and vegetables, whole grains, lean protein, and low-fat dairy products.  Take vitamin and mineral supplements as recommended  by your health care provider.  Do not drink alcohol if your health care provider tells you not to drink.  If you drink alcohol: ? Limit how much you have to 0-2 drinks a day. ? Be aware of how much alcohol is in your drink. In the [place] one drink equals one 12 oz bottle of beer (355 mL), one 5 oz glass of wine (148 mL), or one 1 oz glass of hard liquor (44 mL). Lifestyle  Take daily care of your teeth and gums.  Stay active. Exercise for at least 30 minutes on 5 or more days each week.  Do not use any products that contain nicotine or tobacco, such as cigarettes, e-cigarettes, and chewing tobacco. If you need help quitting, ask your health care provider.  If you are sexually active, practice safe sex. Use a condom or other form of protection to prevent STIs (sexually transmitted infections).  Talk with your health care provider about taking a low-dose aspirin every day starting at age 33. What's next?  Go to your health care provider once a year for a well check visit.  Ask your health care provider how often you should have your eyes and teeth checked.  Stay up to date on all vaccines. This information is not intended to replace advice given to you by your health care provider. Make sure you discuss any questions you have with your health care provider. Document Released: 07/17/2015 Document Revised: 06/14/2018 Document Reviewed: 06/14/2018 Elsevier Patient Education  2020 [employer].

## 2019-05-24 ENCOUNTER — Ambulatory Visit: Payer: Self-pay

## 2019-05-24 NOTE — Telephone Encounter (Signed)
Outgoing call to Patient who states that he has been experiencing sinus congestion  and unbalance gait.  Reports that pain is on the right side near right side.  Yesterday was onset.  .  Rated moderate.  States that he can breathe thru his nose.  Nasal  Discharge is clear to white.  98.2.  States that right tonsil on right side is sore.Providedcare advice and recommended ti increase water in take for congestion and /or try mucinex.  Voiced understanding.  Patient uses [facility].  .               Reason for Disposition . [1] Sinus congestion as part of a cold AND [2] present < 10 days  Answer Assessment - Initial Assessment Questions 1. LOCATION: "Where does it hurt?"      r hand side near right eye 2. ONSET: "When did the sinus pain start?"  (e.g., hours, days)     Drainage this week tyesterday 3. SEVERITY: "How bad is the pain?"   (Scale 1-10; mild, moderate or severe)   - MILD (1-3): doesn't interfere with normal activities    - MODERATE (4-7): interferes with normal activities (e.g., work or school) or awakens from sleep   - SEVERE (8-10): excruciating pain and patient unable to do any normal activities       moderate 4. RECURRENT SYMPTOM: "Have you ever had sinus problems before?" If so, ask: "When was the last time?" and "What happened that time?"      *No Answer* 5. NASAL CONGESTION: "Is the nose blocked?" If so, ask, "Can you open it or must you breathe through the mouth?"    Breathe  nose 6. NASAL DISCHARGE: "Do you have discharge from your nose?" If so ask, "What color?"     Clear to white  7. FEVER: "Do you have a fever?" If so, ask: "What is it, how was it measured, and when did it start?"     Denies 98.270 to 80 8. OTHER SYMPTOMS: "Do you have any other symptoms?" (e.g., sore throat, cough, earache, difficulty breathing)     Tonsil on right hand side of mouth 9. PREGNANCY: "Is there any chance you are pregnant?" "When was your last menstrual period?"     Na.  Protocols used: SINUS PAIN OR CONGESTION-A-AH

## 2019-05-27 ENCOUNTER — Telehealth: Payer: Self-pay

## 2019-05-27 NOTE — Telephone Encounter (Signed)
Pt notified, scheduled virtual

## 2019-05-28 ENCOUNTER — Encounter: Payer: Self-pay | Admitting: Family Medicine

## 2019-05-28 ENCOUNTER — Ambulatory Visit (INDEPENDENT_AMBULATORY_CARE_PROVIDER_SITE_OTHER): Payer: BC Managed Care – PPO | Admitting: Family Medicine

## 2019-05-28 VITALS — Temp 97.9°F | Ht 68.0 in | Wt 213.0 lb

## 2019-05-28 DIAGNOSIS — J310 Chronic rhinitis: Secondary | ICD-10-CM

## 2019-05-28 DIAGNOSIS — J329 Chronic sinusitis, unspecified: Secondary | ICD-10-CM

## 2019-05-28 DIAGNOSIS — H6981 Other specified disorders of Eustachian tube, right ear: Secondary | ICD-10-CM | POA: Diagnosis not present

## 2019-05-28 MED ORDER — DOXYCYCLINE HYCLATE 100 MG PO TABS: 100.0000 mg | ORAL_TABLET | Freq: Two times a day (BID) | ORAL | 0 refills | Status: DC

## 2019-05-28 NOTE — Telephone Encounter (Signed)
error 

## 2019-05-28 NOTE — Progress Notes (Signed)
Name: Don Patterson   MRN: [id number]    DOB: 1971/01/30   Date:05/28/2019       Progress Note  Subjective:    Chief Complaint  Chief Complaint  Patient presents with  . Sinus Problem    ear congestion , nasal congestion and pressure started last thursday.    I connected with  Nash Dimmer  on 05/28/19 at 10:20 AM EST by a video enabled telemedicine application and verified that I am speaking with the correct person using two identifiers.  I discussed the limitations of evaluation and management by telemedicine and the availability of in person appointments. The patient expressed understanding and agreed to proceed. Staff also discussed with the patient that there may be a patient responsible charge related to this service. Patient Location:  home Provider Location: [facility] clinic  Additional Individuals present: none  Sinus Problem This is a recurrent problem. Episode onset: 5 days ago sx worsened from baseline allergies. The problem is unchanged. There has been no fever. The pain is moderate. Associated symptoms include congestion, ear pain and sinus pressure. Pertinent negatives include no chills, coughing, diaphoresis, headaches, hoarse voice, neck pain, shortness of breath, sneezing, sore throat or swollen glands. Past treatments include oral decongestants and saline sprays. The treatment provided mild relief.   Did start singulair, xyzal, saline nasal spray and generic flonase, sx worsened when he was moving stuff at his job, causing some dust exposure.  His recent CPE about 1-2 weeks ago he had diffusely swollen, erythematous nasal mucosa nasal turbinates and oropharynx we had discussed at that time changing some of his medications to get him better allergy control and he did start Singulair but has not changed any of his other medications.  Since having worsening sinus congestion mostly to his right maxillary sinus right ear he is having some blood-tinged mucoid discharge.  He  has had some relief with Mucinex and Sudafed.  Initially he did have a little bit of sore throat but that improved, he also had some vertigo the second day of his symptoms but that also improved.  He is not having fever, headaches, sweats, nausea, lymphadenopathy, cough congestion or wheeze.  Does have a history of recurrent sinus infections his last was about 1 year ago.     Patient Active Problem List   Diagnosis Date Noted  . Obesity (BMI 30.0-34.9) 05/17/2018  . Hyperlipidemia LDL goal <130 05/18/2016  . Migraine aura without headache 01/14/2015  . Gastroesophageal reflux disease without esophagitis 01/14/2015    Social History   Tobacco Use  . Smoking status: Never Smoker  . Smokeless tobacco: Never Used  Substance Use Topics  . Alcohol use: Yes    Alcohol/week: 0.0 standard drinks    Comment: rarely     Current Outpatient Medications:  .  atorvastatin (LIPITOR) 20 MG tablet, TAKE 1 TABLET BY MOUTH EVERYDAY AT BEDTIME, Disp: 90 tablet, Rfl: 3 .  Cromolyn Sodium (NASAL ALLERGY NA), Place into the nose daily., Disp: , Rfl:  .  Flaxseed, Linseed, (FLAX SEEDS PO), Take by mouth daily., Disp: , Rfl:  .  GuaiFENesin (MUCINEX PO), Take as needed by mouth., Disp: , Rfl:  .  levocetirizine (XYZAL) 5 MG tablet, Take 1 tablet (5 mg total) by mouth daily as needed for allergies., Disp: 90 tablet, Rfl: 1 .  montelukast (SINGULAIR) 10 MG tablet, Take 1 tablet (10 mg total) by mouth at bedtime., Disp: 30 tablet, Rfl: 3 .  Omega-3 Fatty Acids (FISH OIL  PO), Take by mouth daily., Disp: , Rfl:  .  pantoprazole (PROTONIX) 20 MG tablet, Take 1 tablet (20 mg total) by mouth daily., Disp: 30 tablet, Rfl: 2 .  sodium chloride (OCEAN) 0.65 % SOLN nasal spray, Place 1 spray into both nostrils as needed for congestion., Disp: , Rfl:  .  triamcinolone (NASACORT) 55 MCG/ACT AERO nasal inhaler, Place 2 sprays daily into the nose. , Disp: , Rfl:   Allergies  Allergen Reactions  . Cat Hair Extract   .  Dust Mite Extract Other (See Comments)  . Tree Extract     I personally reviewed active problem list, medication list, allergies, family history, social history, health maintenance, notes from last encounter, lab results, imaging with the patient/caregiver today.  Review of Systems  Constitutional: Negative.  Negative for chills, diaphoresis, fever, malaise/fatigue and weight loss.  HENT: Positive for congestion, ear pain, nosebleeds and sinus pressure. Negative for ear discharge, hearing loss, hoarse voice, sneezing and sore throat.   Eyes: Negative.   Respiratory: Negative.  Negative for cough, sputum production, shortness of breath and wheezing.   Gastrointestinal: Negative.  Negative for abdominal pain, nausea and vomiting.  Genitourinary: Negative.   Musculoskeletal: Negative.  Negative for myalgias and neck pain.  Skin: Negative.  Negative for itching and rash.  Neurological: Positive for dizziness (4 days ago, but resolved). Negative for headaches.  Endo/Heme/Allergies: Negative.   Psychiatric/Behavioral: Negative.   All other systems reviewed and are negative.    Objective:   Virtual encounter, vitals limited, only able to obtain the following Today's Vitals   05/28/19 0949 05/28/19 0952  Temp: 97.9 F (36.6 C)   Weight: 213 lb (96.6 kg)   Height: 5\' 8"  (1.727 m)   PainSc:  2    Body mass index is 32.39 kg/m. Nursing Note and Vital Signs reviewed.  Physical Exam Vitals signs and nursing note reviewed.  Constitutional:      General: He is not in acute distress.    Appearance: He is well-developed. He is not ill-appearing, toxic-appearing or diaphoretic.     Comments: Well appearing male, non-toxic, NAD  HENT:     Head: Normocephalic and atraumatic.  Eyes:     General:        Right eye: No discharge.        Left eye: No discharge.     Conjunctiva/sclera: Conjunctivae normal.  Neck:     Musculoskeletal: Normal range of motion.     Trachea: No tracheal  deviation.     Comments: Normal phonation Pulmonary:     Effort: Pulmonary effort is normal. No respiratory distress.     Breath sounds: No stridor.  Musculoskeletal: Normal range of motion.  Skin:    Coloration: Skin is not jaundiced or pale.     Findings: No erythema or rash.  Neurological:     Mental Status: He is alert.     Motor: No abnormal muscle tone.     Coordination: Coordination normal.  Psychiatric:        Mood and Affect: Mood normal.        Behavior: Behavior normal.     PE limited by telephone encounter  No results found for this or any previous visit (from the past 72 hour(s)).  Assessment and Plan:     ICD-10-CM   1. Rhinosinusitis  J31.0    J32.9    worsening allergies vs viral vs ABS, allergic rhinitis - with worsening sx and blood tinged nasal discharge and recent  PE with severe nasal edema and erythema -he is already done conservative measures, feel that he will likely need antibiotics.  Encouraged him to continue his supportive and symptomatic treatment and with any acute worsening of pain start doxycycline and stop Sudafed  2. Acute dysfunction of right eustachian tube  H69.81   Explained patient that eustachian tube dysfunction is likely to linger for a few weeks once he has completed antibiotics he can return to using decongestants, antihistamines, steroid nasal sprays and Singulair to manage his ear symptoms  With possible viral etiology also instructed the patient to isolate for the next 5 to 7 days, since testing currently is very backed up likely long wait times and long result times.  Encouraged to stay at home and isolate avoid any family gatherings unfortunately over Thanksgiving to err on the side of tremendous caution with COVID-19 pandemic   -Red flags and when to present for emergency care or RTC including fever >101.53F, chest pain, shortness of breath, new/worsening/un-resolving symptoms,  reviewed with patient at time of visit. Follow up and  care instructions discussed and provided in AVS. - I discussed the assessment and treatment plan with the patient. The patient was provided an opportunity to ask questions and all were answered. The patient agreed with the plan and demonstrated an understanding of the instructions.  I provided 12 minutes of non-face-to-face time during this encounter.  Follow up if not improving in 5-7 d  Delsa Grana, PA-C 05/28/19 10:52 AM

## 2019-07-11 ENCOUNTER — Encounter: Payer: Self-pay | Admitting: Family Medicine

## 2019-07-30 ENCOUNTER — Other Ambulatory Visit: Payer: Self-pay | Admitting: Family Medicine

## 2019-07-30 DIAGNOSIS — K219 Gastro-esophageal reflux disease without esophagitis: Secondary | ICD-10-CM

## 2019-08-11 ENCOUNTER — Other Ambulatory Visit: Payer: Self-pay | Admitting: Family Medicine

## 2019-08-11 DIAGNOSIS — J309 Allergic rhinitis, unspecified: Secondary | ICD-10-CM

## 2019-08-31 ENCOUNTER — Encounter: Payer: Self-pay | Admitting: Family Medicine

## 2019-09-15 ENCOUNTER — Other Ambulatory Visit: Payer: Self-pay | Admitting: Family Medicine

## 2019-09-15 DIAGNOSIS — J309 Allergic rhinitis, unspecified: Secondary | ICD-10-CM

## 2019-09-16 NOTE — Telephone Encounter (Signed)
Refill request for general medication.  Last office visit: 05/28/19  No follow-ups on file.

## 2019-09-28 ENCOUNTER — Encounter: Payer: Self-pay | Admitting: Family Medicine

## 2019-10-21 ENCOUNTER — Other Ambulatory Visit: Payer: Self-pay | Admitting: Family Medicine

## 2019-10-21 DIAGNOSIS — K219 Gastro-esophageal reflux disease without esophagitis: Secondary | ICD-10-CM

## 2019-11-18 ENCOUNTER — Encounter: Payer: Self-pay | Admitting: Family Medicine

## 2019-11-18 ENCOUNTER — Other Ambulatory Visit: Payer: Self-pay

## 2019-11-18 ENCOUNTER — Ambulatory Visit: Payer: BC Managed Care – PPO | Admitting: Family Medicine

## 2019-11-18 VITALS — BP 130/80 | HR 68 | Temp 97.1°F | Resp 18 | Ht 68.0 in | Wt 205.1 lb

## 2019-11-18 DIAGNOSIS — E785 Hyperlipidemia, unspecified: Secondary | ICD-10-CM

## 2019-11-18 DIAGNOSIS — J309 Allergic rhinitis, unspecified: Secondary | ICD-10-CM

## 2019-11-18 DIAGNOSIS — Z5181 Encounter for therapeutic drug level monitoring: Secondary | ICD-10-CM

## 2019-11-18 DIAGNOSIS — E669 Obesity, unspecified: Secondary | ICD-10-CM | POA: Diagnosis not present

## 2019-11-18 DIAGNOSIS — K219 Gastro-esophageal reflux disease without esophagitis: Secondary | ICD-10-CM

## 2019-11-18 MED ORDER — MONTELUKAST SODIUM 10 MG PO TABS: ORAL_TABLET | ORAL | 3 refills | Status: DC

## 2019-11-18 MED ORDER — PANTOPRAZOLE SODIUM 20 MG PO TBEC: 20.0000 mg | DELAYED_RELEASE_TABLET | Freq: Every day | ORAL | 3 refills | Status: DC

## 2019-11-18 MED ORDER — LEVOCETIRIZINE DIHYDROCHLORIDE 5 MG PO TABS: 5.0000 mg | ORAL_TABLET | Freq: Every day | ORAL | 3 refills | Status: DC | PRN

## 2019-11-18 NOTE — Progress Notes (Signed)
Name: Don Patterson   MRN: [id number]    DOB: Jul 06, 1970   Date:11/18/2019       Progress Note  Chief Complaint  Patient presents with  . Follow-up    6 month recheck  . Gastroesophageal Reflux    reflux worse lately  . Allergic Rhinitis   . Hyperlipidemia     Subjective:   Don Patterson is a 49 y.o. male, presents to clinic for routine follow up on the conditions listed above.  GERD:  Worsening sx with more reflux, belching and indigestion since the 1st and sx seemed to follow increase in physical activity (walking more 6 days a week 45 min and doing yoga?) gas increased.  Taking protonix 20 mg daily with breakfast.  He denies any abdominal pain any change in his bowel movements  HLD: on lipitor 20 mg daily for a few years -he has not been on any higher doses or other medications that he can recall Last Lipids:  Last labs cholesterol and trigs not well controlled Lab Results  Component Value Date   CHOL 182 05/20/2019   HDL 39 (L) 05/20/2019   LDLCALC 110 (H) 05/20/2019   TRIG 212 (H) 05/20/2019   CHOLHDL 4.7 05/20/2019  - Current Diet:  Still trying to work on healthy diet - Denies: Chest pain, shortness of breath, myalgias. - Documented aortic atherosclerosis? No - Risk factors for atherosclerosis: hypercholesterolemia  Mild ALT elevation with last couple labs-he has had very mild elevation few times in the past couple years LDL and trigs mildly elevated  He continues to take his daily medication for allergic rhinitis which is typically worse in spring season and with pollen.  Wearing a mask has helped his symptoms somewhat compared to other years.  He also uses air filters at work and at home. Uses cromolyn sodium for nasal allergies also Xyzal and Singulair daily at bedtime  Patient Active Problem List   Diagnosis Date Noted  . Obesity (BMI 30.0-34.9) 05/17/2018  . Hyperlipidemia LDL goal <130 05/18/2016  . Migraine aura without headache 01/14/2015  .  Gastroesophageal reflux disease without esophagitis 01/14/2015    Past Surgical History:  Procedure Laterality Date  . CYST REMOVAL NECK Left 1999    Family History  Problem Relation Age of Onset  . Diabetes Mother   . Cancer Mother        breast and ovarian  . Heart disease Father   . Heart disease Paternal Uncle     Social History   Tobacco Use  . Smoking status: Never Smoker  . Smokeless tobacco: Never Used  Substance Use Topics  . Alcohol use: Yes    Alcohol/week: 0.0 standard drinks    Comment: rarely  . Drug use: No      Current Outpatient Medications:  .  atorvastatin (LIPITOR) 20 MG tablet, TAKE 1 TABLET BY MOUTH EVERYDAY AT BEDTIME, Disp: 90 tablet, Rfl: 3 .  Cromolyn Sodium (NASAL ALLERGY NA), Place into the nose daily., Disp: , Rfl:  .  Flaxseed, Linseed, (FLAX SEEDS PO), Take by mouth daily., Disp: , Rfl:  .  levocetirizine (XYZAL) 5 MG tablet, Take 1 tablet (5 mg total) by mouth daily as needed for allergies., Disp: 90 tablet, Rfl: 1 .  montelukast (SINGULAIR) 10 MG tablet, TAKE 1 TABLET BY MOUTH EVERYDAY AT BEDTIME, Disp: 90 tablet, Rfl: 1 .  Omega-3 Fatty Acids (FISH OIL PO), Take by mouth daily., Disp: , Rfl:  .  pantoprazole (PROTONIX) 20 MG  tablet, TAKE 1 TABLET BY MOUTH EVERY DAY, Disp: 90 tablet, Rfl: 0 .  doxycycline (VIBRA-TABS) 100 MG tablet, Take 1 tablet (100 mg total) by mouth 2 (two) times daily., Disp: 20 tablet, Rfl: 0 .  GuaiFENesin (MUCINEX PO), Take as needed by mouth., Disp: , Rfl:  .  sodium chloride (OCEAN) 0.65 % SOLN nasal spray, Place 1 spray into both nostrils as needed for congestion., Disp: , Rfl:  .  triamcinolone (NASACORT) 55 MCG/ACT AERO nasal inhaler, Place 2 sprays daily into the nose. , Disp: , Rfl:   Allergies  Allergen Reactions  . Cat Hair Extract   . Dust Mite Extract Other (See Comments)  . Tree Extract     Chart Review Today: I personally reviewed active problem list, medication list, allergies, family history,  social history, health maintenance, notes from last encounter, lab results, imaging with the patient/caregiver today.   Review of Systems  10 Systems reviewed and are negative for acute change except as noted in the HPI.  Objective:    Vitals:   11/18/19 0800  BP: 130/80  Pulse: 68  Resp: 18  Temp: (!) 97.1 F (36.2 C)  TempSrc: Temporal  SpO2: 99%  Weight: 205 lb 1.6 oz (93 kg)  Height: 5\' 8"  (1.727 m)    Body mass index is 31.19 kg/m.  Physical Exam Vitals and nursing note reviewed.  Constitutional:      General: He is not in acute distress.    Appearance: Normal appearance. He is well-developed. He is not ill-appearing, toxic-appearing or diaphoretic.     Interventions: Face mask in place.  HENT:     Head: Normocephalic and atraumatic.     Jaw: No trismus.     Right Ear: External ear normal.     Left Ear: External ear normal.  Eyes:     General: Lids are normal. No scleral icterus.    Conjunctiva/sclera: Conjunctivae normal.     Pupils: Pupils are equal, round, and reactive to light.  Neck:     Trachea: Trachea and phonation normal. No tracheal deviation.  Cardiovascular:     Rate and Rhythm: Normal rate and regular rhythm.     Pulses: Normal pulses.          Radial pulses are 2+ on the right side and 2+ on the left side.       Posterior tibial pulses are 2+ on the right side and 2+ on the left side.     Heart sounds: Normal heart sounds. No murmur. No friction rub. No gallop.   Pulmonary:     Effort: Pulmonary effort is normal. No respiratory distress.     Breath sounds: Normal breath sounds. No stridor. No wheezing, rhonchi or rales.  Abdominal:     General: Bowel sounds are normal. There is no distension.     Palpations: Abdomen is soft.     Tenderness: There is no abdominal tenderness. There is no guarding or rebound.  Musculoskeletal:        General: Normal range of motion.     Cervical back: Normal range of motion and neck supple.     Right lower  leg: No edema.     Left lower leg: No edema.  Skin:    General: Skin is warm and dry.     Capillary Refill: Capillary refill takes less than 2 seconds.     Coloration: Skin is not jaundiced.     Findings: No rash.     Nails: There  is no clubbing.  Neurological:     Mental Status: He is alert.     Cranial Nerves: No dysarthria or facial asymmetry.     Motor: No tremor or abnormal muscle tone.     Gait: Gait normal.  Psychiatric:        Mood and Affect: Mood normal.        Speech: Speech normal.        Behavior: Behavior normal. Behavior is cooperative.        PHQ2/9: Depression screen [facility] 2/9 11/18/2019 05/28/2019 05/20/2019 05/17/2018 05/17/2018  Decreased Interest 0 - 0 0 0  Down, Depressed, Hopeless 0 0 0 0 0  PHQ - 2 Score 0 0 0 0 0  Altered sleeping 0 0 0 0 -  Tired, decreased energy 0 0 0 0 -  Change in appetite 0 0 0 0 -  Feeling bad or failure about yourself  0 0 0 0 -  Trouble concentrating 0 0 0 0 -  Moving slowly or fidgety/restless 0 0 0 0 -  Suicidal thoughts 0 0 0 0 -  PHQ-9 Score 0 0 0 0 -  Difficult doing work/chores Not difficult at all Not difficult at all Not difficult at all Not difficult at all -    phq 9 is neg, reviewed today  Fall Risk: Fall Risk  11/18/2019 05/28/2019 05/20/2019 05/17/2018 05/15/2017  Falls in the past year? 0 0 0 0 No  Number falls in past yr: 0 0 0 - -  Injury with Fall? 0 0 0 - -  Follow up Falls evaluation completed - - - -     Assessment & Plan:   1. Hyperlipidemia, unspecified hyperlipidemia type On Lipitor 20 mg tablet, last lipid panel was not well controlled, he is compliant with the medication has not had any side effects or concerns he denies myalgias.  He has been working on a healthier diet lifestyle.  He is open to increasing or changing the medicine if lipids are still not well controlled.  Due for labs - COMPLETE METABOLIC PANEL WITH GFR - Lipid panel  2. Gastroesophageal reflux disease without esophagitis  Sx not currently well controlled Refill on Protonix, take the same dose take on empty stomach, if not improving can take twice daily or increase to 40 mg daily, additionally can add Pepcid and Zantac over-the-counter and as instructed.  Follow-up if not improving - pantoprazole (PROTONIX) 20 MG tablet; Take 1 tablet (20 mg total) by mouth daily. On an empty stomach  Dispense: 90 tablet; Refill: 3  3. Obesity (BMI 30.0-34.9) Continue working on healthy diet and exercise  4. Allergic rhinitis, unspecified seasonality, unspecified trigger Severe symptoms, patient is avoiding allergy triggers, using filters at work and at home, continue nasal sprays, antihistamines, add Singulair, can follow-up with ENT if desired patient encouraged to follow-up if he wants a referral - levocetirizine (XYZAL) 5 MG tablet; Take 1 tablet (5 mg total) by mouth daily as needed for allergies.  Dispense: 90 tablet; Refill: 3 - montelukast (SINGULAIR) 10 MG tablet; TAKE 1 TABLET BY MOUTH EVERYDAY AT BEDTIME  Dispense: 90 tablet; Refill: 3  5. Encounter for medication monitoring - COMPLETE METABOLIC PANEL WITH GFR - Lipid panel     Delsa Grana, PA-C 11/18/19 8:12 AM

## 2019-11-19 LAB — COMPLETE METABOLIC PANEL WITH GFR
AG Ratio: 1.8 (calc) (ref 1.0–2.5)
ALT: 41 U/L (ref 9–46)
AST: 23 U/L (ref 10–40)
Albumin: 4.5 g/dL (ref 3.6–5.1)
Alkaline phosphatase (APISO): 73 U/L (ref 36–130)
BUN: 9 mg/dL (ref 7–25)
CO2: 27 mmol/L (ref 20–32)
Calcium: 9.9 mg/dL (ref 8.6–10.3)
Chloride: 102 mmol/L (ref 98–110)
Creat: 1.21 mg/dL (ref 0.60–1.35)
GFR, Est African American: 83 mL/min/{1.73_m2} (ref >=60)
GFR, Est Non African American: 71 mL/min/{1.73_m2} (ref >=60)
Globulin: 2.5 g/dL (calc) (ref 1.9–3.7)
Glucose, Bld: 103 mg/dL — ABNORMAL HIGH (ref 65–99)
Potassium: 5.3 mmol/L (ref 3.5–5.3)
Sodium: 142 mmol/L (ref 135–146)
Total Bilirubin: 0.7 mg/dL (ref 0.2–1.2)
Total Protein: 7 g/dL (ref 6.1–8.1)

## 2019-11-19 LAB — LIPID PANEL
Cholesterol: 166 mg/dL (ref <200)
HDL: 44 mg/dL (ref >=40)
LDL Cholesterol (Calc): 97 mg/dL (calc)
Non-HDL Cholesterol (Calc): 122 mg/dL (calc) (ref <130)
Total CHOL/HDL Ratio: 3.8 (calc) (ref <5.0)
Triglycerides: 153 mg/dL — ABNORMAL HIGH (ref <150)

## 2019-11-20 ENCOUNTER — Encounter: Payer: Self-pay | Admitting: Family Medicine

## 2019-11-20 DIAGNOSIS — J309 Allergic rhinitis, unspecified: Secondary | ICD-10-CM | POA: Insufficient documentation

## 2020-05-12 ENCOUNTER — Encounter: Payer: Self-pay | Admitting: Family Medicine

## 2020-05-20 ENCOUNTER — Other Ambulatory Visit: Payer: Self-pay

## 2020-05-20 ENCOUNTER — Ambulatory Visit: Payer: BC Managed Care – PPO | Admitting: Family Medicine

## 2020-05-20 ENCOUNTER — Encounter: Payer: Self-pay | Admitting: Family Medicine

## 2020-05-20 VITALS — BP 124/76 | HR 64 | Temp 97.7°F | Resp 18 | Ht 68.0 in | Wt 197.0 lb

## 2020-05-20 DIAGNOSIS — Z1211 Encounter for screening for malignant neoplasm of colon: Secondary | ICD-10-CM

## 2020-05-20 DIAGNOSIS — E785 Hyperlipidemia, unspecified: Secondary | ICD-10-CM

## 2020-05-20 DIAGNOSIS — Z23 Encounter for immunization: Secondary | ICD-10-CM | POA: Diagnosis not present

## 2020-05-20 DIAGNOSIS — J309 Allergic rhinitis, unspecified: Secondary | ICD-10-CM

## 2020-05-20 DIAGNOSIS — J329 Chronic sinusitis, unspecified: Secondary | ICD-10-CM

## 2020-05-20 DIAGNOSIS — Z0000 Encounter for general adult medical examination without abnormal findings: Secondary | ICD-10-CM | POA: Diagnosis not present

## 2020-05-20 DIAGNOSIS — K219 Gastro-esophageal reflux disease without esophagitis: Secondary | ICD-10-CM

## 2020-05-20 DIAGNOSIS — Z1159 Encounter for screening for other viral diseases: Secondary | ICD-10-CM

## 2020-05-20 DIAGNOSIS — J310 Chronic rhinitis: Secondary | ICD-10-CM

## 2020-05-20 DIAGNOSIS — E663 Overweight: Secondary | ICD-10-CM

## 2020-05-20 MED ORDER — ATORVASTATIN CALCIUM 20 MG PO TABS: ORAL_TABLET | ORAL | 3 refills | Status: DC

## 2020-05-20 MED ORDER — AMOXICILLIN-POT CLAVULANATE 875-125 MG PO TABS: 1.0000 | ORAL_TABLET | Freq: Two times a day (BID) | ORAL | 0 refills | Status: AC

## 2020-05-20 NOTE — Progress Notes (Signed)
Patient: Don Patterson, Male    DOB: 1971-03-17, 49 y.o.   MRN: [id number] Delsa Grana, PA-C Visit Date: 05/20/2020  Today's Provider: Delsa Grana, PA-C   Chief Complaint  Patient presents with  . Annual Exam   Subjective:   Annual physical exam:  Don Patterson is a 49 y.o. male who presents today for health maintenance and annual & complete physical exam.   Exercise/Activity: Not exercising much right now due to very busy work schedule Diet/nutrition: Overall healthy Sleep: Sleeping fairly well  HLD -on Lipitor 20 mg, good med compliance, no myalgias fatigue side effects or concerns.  He has not been doing anything new or additional for any diet or lifestyle efforts.  Labs last done in May 2021 LDL was 97 had declined from 110 and mildly elevated LFTs did also improve  Rhinosinusitis and seasonal allergies -chronic and recurrent, has recently worsened with weather changes and temperature changes he notes increased pressure to right side of his face and temple He continues to manage his nasal symptoms and allergies with Singulair, Xyzal, saline spray and intranasal steroid spray He does note increased congestion, nasal drainage, some bloody nasal discharge this morning and postnasal drip causing some slight congestion and discomfort in his throat, denies any severe pain or fever but worsening symptoms have been ongoing for a while  GERD-well-controlled with pantoprazole 20 mg daily  USPSTF grade A and B recommendations - reviewed and addressed today  Depression:  Phq 9 completed today by patient, was reviewed by me with patient in the room, score is  negative, pt feels good PHQ 2/9 Scores 05/20/2020 11/18/2019 05/28/2019 05/20/2019  PHQ - 2 Score 0 0 0 0  PHQ- 9 Score - 0 0 0   Depression screen [facility] 2/9 05/20/2020 11/18/2019 05/28/2019 05/20/2019 05/17/2018  Decreased Interest 0 0 - 0 0  Down, Depressed, Hopeless 0 0 0 0 0  PHQ - 2 Score 0 0 0 0 0  Altered  sleeping - 0 0 0 0  Tired, decreased energy - 0 0 0 0  Change in appetite - 0 0 0 0  Feeling bad or failure about yourself  - 0 0 0 0  Trouble concentrating - 0 0 0 0  Moving slowly or fidgety/restless - 0 0 0 0  Suicidal thoughts - 0 0 0 0  PHQ-9 Score - 0 0 0 0  Difficult doing work/chores - Not difficult at all Not difficult at all Not difficult at all Not difficult at all    Hep C Screening: due STD testing and prevention (HIV/chl/gon/syphilis):  Declines HIV testing, not currently sexually active unknown new or recent partners Intimate partner violence: Feels safe  Prostate cancer: Denies urinary symptoms Prostate cancer screening with PSA: Discussed risks and benefits of PSA testing and provided handout.  For his age and lack of symptoms I do not recommend any PSA screening  Advanced Care Planning:  A voluntary discussion about advance care planning including the explanation and discussion of advance directives.   Patient does not have a living will at present time. If patient does have living will, I have requested they bring this to the clinic to be scanned in to their chart.  Health Maintenance  Topic Date Due  . Hepatitis C Screening  Never done  . HIV Screening  Never done  . TETANUS/TDAP  05/17/2028  . INFLUENZA VACCINE  Completed  . COVID-19 Vaccine  Completed     Skin cancer:  Pt reports no hx of skin cancer, suspicious lesions/biopsies in the past.  Colorectal cancer:  colonoscopy is due for age   Pt denies melena, hematochezia, bowel changes  Lung cancer:   Low Dose CT Chest recommended if Age 75-80 years, 20 pack-year currently smoking OR have quit w/in 15years. Patient does not qualify.   Social History   Tobacco Use  . Smoking status: Never Smoker  . Smokeless tobacco: Never Used  Substance Use Topics  . Alcohol use: Yes    Alcohol/week: 0.0 standard drinks    Comment: rarely     Alcohol screening:   Office Visit from 05/20/2020 in [facility]  AUDIT-C Score 0      AAA:  The USPSTF recommends one-time screening with ultrasonography in men ages 62 to 101 years who have ever smoked  ECG: July 2016 NSR  Blood pressure/Hypertension: BP Readings from Last 3 Encounters:  05/20/20 124/76  11/18/19 130/80  05/20/19 122/82   Weight/Obesity: Wt Readings from Last 3 Encounters:  05/20/20 197 lb (89.4 kg)  11/18/19 205 lb 1.6 oz (93 kg)  05/28/19 213 lb (96.6 kg)   BMI Readings from Last 3 Encounters:  05/20/20 29.95 kg/m  11/18/19 31.19 kg/m  05/28/19 32.39 kg/m    Lipids:  Lab Results  Component Value Date   CHOL 166 11/18/2019   CHOL 182 05/20/2019   CHOL 165 05/17/2018   Lab Results  Component Value Date   HDL 44 11/18/2019   HDL 39 (L) 05/20/2019   HDL 40 (L) 05/17/2018   Lab Results  Component Value Date   LDLCALC 97 11/18/2019   LDLCALC 110 (H) 05/20/2019   LDLCALC 96 05/17/2018   Lab Results  Component Value Date   TRIG 153 (H) 11/18/2019   TRIG 212 (H) 05/20/2019   TRIG 192 (H) 05/17/2018   Lab Results  Component Value Date   CHOLHDL 3.8 11/18/2019   CHOLHDL 4.7 05/20/2019   CHOLHDL 4.1 05/17/2018   No results found for: LDLDIRECT Based on the results of lipid panel his/her cardiovascular risk factor ( using [place] )  in the next 10 years is : The 10-year ASCVD risk score Mikey Bussing DC Brooke Bonito., et al., 2013) is: 2.1%   Values used to calculate the score:     Age: 71 years     Sex: Male     Is Non-Hispanic African American: No     Diabetic: No     Tobacco smoker: No     Systolic Blood Pressure: 270 mmHg     Is BP treated: No     HDL Cholesterol: 44 mg/dL     Total Cholesterol: 166 mg/dL Glucose:  Glucose, Bld  Date Value Ref Range Status  11/18/2019 103 (H) 65 - 99 mg/dL Final    Comment:    .            Fasting reference interval . For someone without known diabetes, a glucose value between 100 and 125 mg/dL is consistent with prediabetes and should be confirmed  with a follow-up test. .   05/20/2019 91 65 - 99 mg/dL Final    Comment:    .            Fasting reference interval .   05/17/2018 95 65 - 99 mg/dL Final    Comment:    .            Fasting reference interval .     Social History  He  reports that he has never smoked. He has never used smokeless tobacco. He reports current alcohol use. He reports that he does not use drugs.       Social History   Socioeconomic History  . Marital status: Single    Spouse name: Not on file  . Number of children: Not on file  . Years of education: 43  . Highest education level: Bachelor's degree (e.g., BA, AB, BS)  Occupational History  . Not on file  Tobacco Use  . Smoking status: Never Smoker  . Smokeless tobacco: Never Used  Vaping Use  . Vaping Use: Never used  Substance and Sexual Activity  . Alcohol use: Yes    Alcohol/week: 0.0 standard drinks    Comment: rarely  . Drug use: No  . Sexual activity: Not Currently  Other Topics Concern  . Not on file  Social History Narrative  . Not on file   Social Determinants of Health   Financial Resource Strain: Low Risk   . Difficulty of Paying Living Expenses: Not hard at all  Food Insecurity: No Food Insecurity  . Worried About Programme researcher, broadcasting/film/video in the Last Year: Never true  . Ran Out of Food in the Last Year: Never true  Transportation Needs: No Transportation Needs  . Lack of Transportation (Medical): No  . Lack of Transportation (Non-Medical): No  Physical Activity: Insufficiently Active  . Days of Exercise per Week: 2 days  . Minutes of Exercise per Session: 20 min  Stress: Stress Concern Present  . Feeling of Stress : To some extent  Social Connections: Moderately Integrated  . Frequency of Communication with Friends and Family: More than three times a week  . Frequency of Social Gatherings with Friends and Family: Twice a week  . Attends Religious Services: 1 to 4 times per year  . Active Member of Clubs or  Organizations: Yes  . Attends Banker Meetings: 1 to 4 times per year  . Marital Status: Never married     Family History        Family Status  Relation Name Status  . Mother  Alive  . Father  Alive  . Oneal Grout  Alive        His family history includes Cancer in his mother; Heart disease in his father and paternal uncle.       Family History  Problem Relation Age of Onset  . Cancer Mother        breast and ovarian  . Heart disease Father   . Heart disease Paternal Uncle     Patient Active Problem List   Diagnosis Date Noted  . Allergic rhinitis 11/20/2019  . Obesity (BMI 30.0-34.9) 05/17/2018  . Hyperlipidemia 05/18/2016  . Migraine aura without headache 01/14/2015  . Gastroesophageal reflux disease without esophagitis 01/14/2015    Past Surgical History:  Procedure Laterality Date  . CYST REMOVAL NECK Left 1999     Current Outpatient Medications:  .  atorvastatin (LIPITOR) 20 MG tablet, TAKE 1 TABLET BY MOUTH EVERYDAY AT BEDTIME, Disp: 90 tablet, Rfl: 3 .  Flaxseed, Linseed, (FLAX SEEDS PO), Take by mouth daily., Disp: , Rfl:  .  GuaiFENesin (MUCINEX PO), Take as needed by mouth., Disp: , Rfl:  .  levocetirizine (XYZAL) 5 MG tablet, Take 1 tablet (5 mg total) by mouth daily as needed for allergies., Disp: 90 tablet, Rfl: 3 .  montelukast (SINGULAIR) 10 MG tablet, TAKE 1 TABLET BY MOUTH EVERYDAY  AT BEDTIME, Disp: 90 tablet, Rfl: 3 .  Omega-3 Fatty Acids (FISH OIL PO), Take by mouth daily., Disp: , Rfl:  .  pantoprazole (PROTONIX) 20 MG tablet, Take 1 tablet (20 mg total) by mouth daily. On an empty stomach, Disp: 90 tablet, Rfl: 3 .  sodium chloride (OCEAN) 0.65 % SOLN nasal spray, Place 1 spray into both nostrils as needed for congestion., Disp: , Rfl:  .  triamcinolone (NASACORT) 55 MCG/ACT AERO nasal inhaler, Place 2 sprays daily into the nose. , Disp: , Rfl:  .  Cromolyn Sodium (NASAL ALLERGY NA), Place into the nose daily., Disp: , Rfl:    Allergies  Allergen Reactions  . Cat Hair Extract   . Dust Mite Extract Other (See Comments)  . Tree Extract     Patient Care Team: Delsa Grana, PA-C as PCP - General (Family Medicine)   Chart Review: I personally reviewed active problem list, medication list, allergies, family history, social history, health maintenance, notes from last encounter, lab results, imaging with the patient/caregiver today.   Review of Systems  10 Systems reviewed and are negative for acute change except as noted in the HPI.       Objective:   Vitals:  Vitals:   05/20/20 0807  BP: 124/76  Pulse: 64  Resp: 18  Temp: 97.7 F (36.5 C)  TempSrc: Oral  SpO2: 98%  Weight: 197 lb (89.4 kg)  Height: $Remove'5\' 8"'tmOrhKK$  (1.727 m)    Body mass index is 29.95 kg/m.  Physical Exam Vitals and nursing note reviewed.  Constitutional:      General: He is not in acute distress.    Appearance: Normal appearance. He is well-developed, well-groomed and overweight. He is not ill-appearing, toxic-appearing or diaphoretic.     Interventions: Face mask in place.  HENT:     Head: Normocephalic and atraumatic.     Jaw: No trismus.     Right Ear: Hearing, tympanic membrane, ear canal and external ear normal.     Left Ear: Hearing, tympanic membrane, ear canal and external ear normal.     Nose: Mucosal edema, congestion and rhinorrhea present. Rhinorrhea is clear.     Right Turbinates: Swollen.     Left Turbinates: Swollen.     Right Sinus: Maxillary sinus tenderness and frontal sinus tenderness present.     Left Sinus: No maxillary sinus tenderness or frontal sinus tenderness.     Comments: Diffusely erythematous nasal mucosa with some bright red blood noted to left nare, no blood clots and no active epistaxis Posterior oropharynx edematous and erythematous no exudate    Mouth/Throat:     Mouth: Mucous membranes are moist. Mucous membranes are not pale, not dry and not cyanotic.     Pharynx: Uvula midline.  Pharyngeal swelling and posterior oropharyngeal erythema present. No oropharyngeal exudate or uvula swelling.     Tonsils: No tonsillar exudate or tonsillar abscesses.  Eyes:     General: Lids are normal. No scleral icterus.       Right eye: No discharge.        Left eye: No discharge.     Conjunctiva/sclera: Conjunctivae normal.     Pupils: Pupils are equal, round, and reactive to light.  Neck:     Trachea: Trachea and phonation normal. No tracheal deviation.  Cardiovascular:     Rate and Rhythm: Normal rate and regular rhythm.     Pulses: Normal pulses.          Radial pulses are 2+  on the right side and 2+ on the left side.       Posterior tibial pulses are 2+ on the right side and 2+ on the left side.     Heart sounds: Normal heart sounds. No murmur heard.  No friction rub. No gallop.   Pulmonary:     Effort: Pulmonary effort is normal. No tachypnea, accessory muscle usage or respiratory distress.     Breath sounds: Normal breath sounds. No stridor. No decreased breath sounds, wheezing, rhonchi or rales.  Abdominal:     General: Bowel sounds are normal. There is no distension.     Palpations: Abdomen is soft.     Tenderness: There is no abdominal tenderness. There is no guarding.  Musculoskeletal:     Cervical back: Normal range of motion and neck supple.     Right lower leg: No edema.     Left lower leg: No edema.  Lymphadenopathy:     Cervical: No cervical adenopathy.  Skin:    General: Skin is warm and dry.     Coloration: Skin is not jaundiced or pale.     Findings: No rash.     Nails: There is no clubbing.  Neurological:     Mental Status: He is alert. Mental status is at baseline.     Cranial Nerves: No dysarthria or facial asymmetry.     Motor: No tremor or abnormal muscle tone.     Coordination: Coordination normal.     Gait: Gait normal.  Psychiatric:        Mood and Affect: Mood normal.        Speech: Speech normal.        Behavior: Behavior normal. Behavior  is cooperative.       Fall Risk: Fall Risk  05/20/2020 11/18/2019 05/28/2019 05/20/2019 05/17/2018  Falls in the past year? 0 0 0 0 0  Number falls in past yr: 0 0 0 0 -  Injury with Fall? 0 0 0 0 -  Follow up Falls evaluation completed Falls evaluation completed - - -    Functional Status Survey: Is the patient deaf or have difficulty hearing?: No Does the patient have difficulty seeing, even when wearing glasses/contacts?: No Does the patient have difficulty concentrating, remembering, or making decisions?: No Does the patient have difficulty walking or climbing stairs?: No Does the patient have difficulty dressing or bathing?: No Does the patient have difficulty doing errands alone such as visiting a doctor's office or shopping?: No   Assessment & Plan:    CPE completed today   . Prostate cancer screening and PSA options (with potential risks and benefits of testing vs not testing) were discussed along with recent recs/guidelines, shared decision making and handout/information given to pt today  . USPSTF grade A and B recommendations reviewed with patient; age-appropriate recommendations, preventive care, screening tests, etc discussed and encouraged; healthy living encouraged; see AVS for patient education given to patient  . Discussed importance of 150 minutes of physical activity weekly, AHA exercise recommendations given to pt in AVS/handout  . Discussed importance of healthy diet:  eating lean meats and proteins, avoiding trans fats and saturated fats, avoid simple sugars and excessive carbs in diet, eat 6 servings of fruit/vegetables daily and drink plenty of water and avoid sweet beverages.  DASH diet reviewed if pt has HTN  . Recommended pt to do annual eye exam and routine dental exams/cleanings  . Reviewed Health Maintenance: Health Maintenance  Topic Date Due  .  Hepatitis C Screening  Never done  . HIV Screening  Never done  . TETANUS/TDAP  05/17/2028  .  INFLUENZA VACCINE  Completed  . COVID-19 Vaccine  Completed    . Immunizations: Immunization History  Administered Date(s) Administered  . Hepatitis B 08/06/2007  . IPV 08/06/2007  . Influenza,inj,Quad PF,6+ Mos 05/17/2018, 05/20/2019, 05/20/2020  . Influenza-Unspecified 05/10/2016, 05/09/2017, 04/07/2018  . MMR 09/25/2009  . Moderna SARS-COVID-2 Vaccination 08/31/2019, 09/28/2019  . Tdap 08/06/2007, 05/17/2018     ICD-10-CM   1. Adult general medical exam  Z00.00 CBC with Differential/Platelet    COMPLETE METABOLIC PANEL WITH GFR    Lipid panel    Hepatitis C antibody  2. Need for influenza vaccination  Z23 Flu Vaccine QUAD 6+ mos PF IM (Fluarix Quad PF)  3. Hyperlipidemia, unspecified hyperlipidemia type  Y33.3 COMPLETE METABOLIC PANEL WITH GFR    Lipid panel    atorvastatin (LIPITOR) 20 MG tablet   Compliant with statin, weight has declined, recheck lipids if improved continue medications and recheck labs annually  4. Gastroesophageal reflux disease without esophagitis  K21.9    Well-controlled symptoms with pantoprazole  5. Allergic rhinitis, unspecified seasonality, unspecified trigger  J30.9    Continued symptoms, recently worsening  6. Overweight  E66.3    Weight declining he has improved from obesity to now overweight category encouraged continued healthy diet and lifestyle  7. Screening for malignant neoplasm of colon  Z12.11 Ambulatory referral to Gastroenterology   Discussed new guidelines, no current concerning symptoms patient agrees to discuss colonoscopy with GI may need to schedule in summer  8. Encounter for hepatitis C screening test for low risk patient  Z11.59 Hepatitis C antibody  9. Rhinosinusitis  J31.0 amoxicillin-clavulanate (AUGMENTIN) 875-125 MG tablet   J32.9    acute on chronic, recently worsening allergy sx and pressure - on hold augmentin for if cotninues to worsen but very erythematous mucosa today     Delsa Grana, PA-C 05/20/20 8:33  AM  [facility]

## 2020-05-20 NOTE — Patient Instructions (Addendum)
Preventive Care 47-47 Years Old, Male Preventive care refers to lifestyle choices and visits with your health care provider that can promote health and wellness. This includes:  A yearly physical exam. This is also called an annual well check.  Regular dental and eye exams.  Immunizations.  Screening for certain conditions.  Healthy lifestyle choices, such as eating a healthy diet, getting regular exercise, not using drugs or products that contain nicotine and tobacco, and limiting alcohol use. What can I expect for my preventive care visit? Physical exam Your health care provider will check:  Height and weight. These may be used to calculate body mass index (BMI), which is a measurement that tells if you are at a healthy weight.  Heart rate and blood pressure.  Your skin for abnormal spots. Counseling Your health care provider may ask you questions about:  Alcohol, tobacco, and drug use.  Emotional well-being.  Home and relationship well-being.  Sexual activity.  Eating habits.  Work and work environment. What immunizations do I need?  Influenza (flu) vaccine  This is recommended every year. Tetanus, diphtheria, and pertussis (Tdap) vaccine  You may need a Td booster every 10 years. Varicella (chickenpox) vaccine  You may need this vaccine if you have not already been vaccinated. Zoster (shingles) vaccine  You may need this after age 60. Measles, mumps, and rubella (MMR) vaccine  You may need at least one dose of MMR if you were born in 1957 or later. You may also need a second dose. Pneumococcal conjugate (PCV13) vaccine  You may need this if you have certain conditions and were not previously vaccinated. Pneumococcal polysaccharide (PPSV23) vaccine  You may need one or two doses if you smoke cigarettes or if you have certain conditions. Meningococcal conjugate (MenACWY) vaccine  You may need this if you have certain conditions. Hepatitis A  vaccine  You may need this if you have certain conditions or if you travel or work in places where you may be exposed to hepatitis A. Hepatitis B vaccine  You may need this if you have certain conditions or if you travel or work in places where you may be exposed to hepatitis B. Haemophilus influenzae type b (Hib) vaccine  You may need this if you have certain risk factors. Human papillomavirus (HPV) vaccine  If recommended by your health care provider, you may need three doses over 6 months. You may receive vaccines as individual doses or as more than one vaccine together in one shot (combination vaccines). Talk with your health care provider about the risks and benefits of combination vaccines. What tests do I need? Blood tests  Lipid and cholesterol levels. These may be checked every 5 years, or more frequently if you are over 47 years old.  Hepatitis C test.  Hepatitis B test. Screening  Lung cancer screening. You may have this screening every year starting at age 47 if you have a 30-pack-year history of smoking and currently smoke or have quit within the past 15 years.  Prostate cancer screening. Recommendations will vary depending on your family history and other risks.  Colorectal cancer screening. All adults should have this screening starting at age 47 and continuing until age 75. Your health care provider may recommend screening at age 47 if you are at increased risk. You will have tests every 1-10 years, depending on your results and the type of screening test.  Diabetes screening. This is done by checking your blood sugar (glucose) after you have not eaten   for a while (fasting). You may have this done every 1-3 years.  Sexually transmitted disease (STD) testing. Follow these instructions at home: Eating and drinking  Eat a diet that includes fresh fruits and vegetables, whole grains, lean protein, and low-fat dairy products.  Take vitamin and mineral supplements as  recommended by your health care provider.  Do not drink alcohol if your health care provider tells you not to drink.  If you drink alcohol: ? Limit how much you have to 0-2 drinks a day. ? Be aware of how much alcohol is in your drink. In the [place] one drink equals one 12 oz bottle of beer (355 mL), one 5 oz glass of wine (148 mL), or one 1 oz glass of hard liquor (44 mL). Lifestyle  Take daily care of your teeth and gums.  Stay active. Exercise for at least 30 minutes on 5 or more days each week.  Do not use any products that contain nicotine or tobacco, such as cigarettes, e-cigarettes, and chewing tobacco. If you need help quitting, ask your health care provider.  If you are sexually active, practice safe sex. Use a condom or other form of protection to prevent STIs (sexually transmitted infections).  Talk with your health care provider about taking a low-dose aspirin every day starting at age 47. What's next?  Go to your health care provider once a year for a well check visit.  Ask your health care provider how often you should have your eyes and teeth checked.  Stay up to date on all vaccines. This information is not intended to replace advice given to you by your health care provider. Make sure you discuss any questions you have with your health care provider. Document Revised: 06/14/2018 Document Reviewed: 06/14/2018 Elsevier Patient Education  2020 [employer].

## 2020-05-21 LAB — COMPLETE METABOLIC PANEL WITH GFR
AG Ratio: 2 (calc) (ref 1.0–2.5)
ALT: 35 U/L (ref 9–46)
AST: 22 U/L (ref 10–40)
Albumin: 4.7 g/dL (ref 3.6–5.1)
Alkaline phosphatase (APISO): 66 U/L (ref 36–130)
BUN: 9 mg/dL (ref 7–25)
CO2: 29 mmol/L (ref 20–32)
Calcium: 9.9 mg/dL (ref 8.6–10.3)
Chloride: 101 mmol/L (ref 98–110)
Creat: 1.09 mg/dL (ref 0.60–1.35)
GFR, Est African American: 93 mL/min/{1.73_m2} (ref >=60)
GFR, Est Non African American: 80 mL/min/{1.73_m2} (ref >=60)
Globulin: 2.3 g/dL (calc) (ref 1.9–3.7)
Glucose, Bld: 95 mg/dL (ref 65–99)
Potassium: 4.3 mmol/L (ref 3.5–5.3)
Sodium: 139 mmol/L (ref 135–146)
Total Bilirubin: 1 mg/dL (ref 0.2–1.2)
Total Protein: 7 g/dL (ref 6.1–8.1)

## 2020-05-21 LAB — HEPATITIS C ANTIBODY
Hepatitis C Ab: NONREACTIVE
SIGNAL TO CUT-OFF: 0 (ref <1.00)

## 2020-05-21 LAB — CBC WITH DIFFERENTIAL/PLATELET
Absolute Monocytes: 437 cells/uL (ref 200–950)
Basophils Absolute: 83 cells/uL (ref 0–200)
Basophils Relative: 1.4 %
Eosinophils Absolute: 89 cells/uL (ref 15–500)
Eosinophils Relative: 1.5 %
HCT: 47.4 % (ref 38.5–50.0)
Hemoglobin: 16 g/dL (ref 13.2–17.1)
Lymphs Abs: 1876 cells/uL (ref 850–3900)
MCH: 30.7 pg (ref 27.0–33.0)
MCHC: 33.8 g/dL (ref 32.0–36.0)
MCV: 90.8 fL (ref 80.0–100.0)
MPV: 10.7 fL (ref 7.5–12.5)
Monocytes Relative: 7.4 %
Neutro Abs: 3416 cells/uL (ref 1500–7800)
Neutrophils Relative %: 57.9 %
Platelets: 240 10*3/uL (ref 140–400)
RBC: 5.22 10*6/uL (ref 4.20–5.80)
RDW: 12.4 % (ref 11.0–15.0)
Total Lymphocyte: 31.8 %
WBC: 5.9 10*3/uL (ref 3.8–10.8)

## 2020-05-21 LAB — LIPID PANEL
Cholesterol: 164 mg/dL (ref <200)
HDL: 38 mg/dL — ABNORMAL LOW (ref >=40)
LDL Cholesterol (Calc): 99 mg/dL (calc)
Non-HDL Cholesterol (Calc): 126 mg/dL (calc) (ref <130)
Total CHOL/HDL Ratio: 4.3 (calc) (ref <5.0)
Triglycerides: 175 mg/dL — ABNORMAL HIGH (ref <150)

## 2020-05-27 ENCOUNTER — Telehealth (INDEPENDENT_AMBULATORY_CARE_PROVIDER_SITE_OTHER): Payer: Self-pay | Admitting: Gastroenterology

## 2020-05-27 ENCOUNTER — Other Ambulatory Visit: Payer: Self-pay

## 2020-05-27 DIAGNOSIS — Z1211 Encounter for screening for malignant neoplasm of colon: Secondary | ICD-10-CM

## 2020-05-27 MED ORDER — PEG 3350-KCL-NA BICARB-NACL 420 G PO SOLR: 4000.0000 mL | Freq: Once | ORAL | 0 refills | Status: AC

## 2020-05-27 NOTE — Progress Notes (Signed)
Gastroenterology Pre-Procedure Review  Request Date: Monday 07/13/20 Requesting Physician: Dr. Marius Ditch  PATIENT REVIEW QUESTIONS: The patient responded to the following health history questions as indicated:    1. Are you having any GI issues? no 2. Do you have a personal history of Polyps? no 3. Do you have a family history of Colon Cancer or Polyps? no 4. Diabetes Mellitus? no 5. Joint replacements in the past 12 months?no 6. Major health problems in the past 3 months?no 7. Any artificial heart valves, MVP, or defibrillator?no    MEDICATIONS & ALLERGIES:    Patient reports the following regarding taking any anticoagulation/antiplatelet therapy:   Plavix, Coumadin, Eliquis, Xarelto, Lovenox, Pradaxa, Brilinta, or Effient? no Aspirin? no  Patient confirms/reports the following medications:  Current Outpatient Medications  Medication Sig Dispense Refill  . amoxicillin-clavulanate (AUGMENTIN) 875-125 MG tablet Take 1 tablet by mouth 2 (two) times daily for 10 days. 20 tablet 0  . atorvastatin (LIPITOR) 20 MG tablet TAKE 1 TABLET BY MOUTH EVERYDAY AT BEDTIME 90 tablet 3  . Flaxseed, Linseed, (FLAX SEEDS PO) Take by mouth daily.    . GuaiFENesin (MUCINEX PO) Take as needed by mouth.    . levocetirizine (XYZAL) 5 MG tablet Take 1 tablet (5 mg total) by mouth daily as needed for allergies. 90 tablet 3  . montelukast (SINGULAIR) 10 MG tablet TAKE 1 TABLET BY MOUTH EVERYDAY AT BEDTIME 90 tablet 3  . Omega-3 Fatty Acids (FISH OIL PO) Take by mouth daily.    . pantoprazole (PROTONIX) 20 MG tablet Take 1 tablet (20 mg total) by mouth daily. On an empty stomach 90 tablet 3  . sodium chloride (OCEAN) 0.65 % SOLN nasal spray Place 1 spray into both nostrils as needed for congestion.    . triamcinolone (NASACORT) 55 MCG/ACT AERO nasal inhaler Place 2 sprays daily into the nose.      No current facility-administered medications for this visit.    Patient confirms/reports the following allergies:   Allergies  Allergen Reactions  . Cat Hair Extract   . Dust Mite Extract Other (See Comments)  . Tree Extract     No orders of the defined types were placed in this encounter.   AUTHORIZATION INFORMATION Primary Insurance: 1D#: Group #:  Secondary Insurance: 1D#: Group #:  SCHEDULE INFORMATION: Date: Monday 07/13/20 Time: Location:[facility]

## 2020-06-01 ENCOUNTER — Encounter: Payer: Self-pay | Admitting: Gastroenterology

## 2020-06-30 ENCOUNTER — Encounter: Payer: Self-pay | Admitting: Family Medicine

## 2020-07-13 ENCOUNTER — Ambulatory Visit: Admit: 2020-07-13 | Payer: BC Managed Care – PPO | Admitting: Gastroenterology

## 2020-07-13 SURGERY — COLONOSCOPY WITH PROPOFOL
Anesthesia: General

## 2020-11-28 ENCOUNTER — Other Ambulatory Visit: Payer: Self-pay | Admitting: Family Medicine

## 2020-11-28 DIAGNOSIS — J309 Allergic rhinitis, unspecified: Secondary | ICD-10-CM

## 2020-12-08 ENCOUNTER — Other Ambulatory Visit: Payer: Self-pay | Admitting: Family Medicine

## 2020-12-08 DIAGNOSIS — J309 Allergic rhinitis, unspecified: Secondary | ICD-10-CM

## 2020-12-14 ENCOUNTER — Encounter: Payer: Self-pay | Admitting: Gastroenterology

## 2020-12-23 ENCOUNTER — Other Ambulatory Visit: Payer: Self-pay | Admitting: Family Medicine

## 2020-12-23 DIAGNOSIS — K219 Gastro-esophageal reflux disease without esophagitis: Secondary | ICD-10-CM

## 2021-01-08 ENCOUNTER — Other Ambulatory Visit: Payer: Self-pay

## 2021-01-08 ENCOUNTER — Encounter: Payer: Self-pay | Admitting: Family Medicine

## 2021-01-08 ENCOUNTER — Ambulatory Visit: Payer: BC Managed Care – PPO | Admitting: Family Medicine

## 2021-01-08 VITALS — BP 128/74 | HR 60 | Temp 98.1°F | Resp 16 | Ht 68.0 in | Wt 185.8 lb

## 2021-01-08 DIAGNOSIS — Z1211 Encounter for screening for malignant neoplasm of colon: Secondary | ICD-10-CM | POA: Diagnosis not present

## 2021-01-08 DIAGNOSIS — R109 Unspecified abdominal pain: Secondary | ICD-10-CM | POA: Diagnosis not present

## 2021-01-08 DIAGNOSIS — Z114 Encounter for screening for human immunodeficiency virus [HIV]: Secondary | ICD-10-CM | POA: Diagnosis not present

## 2021-01-08 DIAGNOSIS — K625 Hemorrhage of anus and rectum: Secondary | ICD-10-CM | POA: Diagnosis not present

## 2021-01-08 NOTE — Patient Instructions (Signed)
Try cutting out dairy for a period of time, then you could try cutting out gluten. I put in a referral to GI for the problems so they will need to see you in the office for a consult and then determine what kind of work up you may need - this may include a colonscopy for diagnostic purposes (not cancer screening)  Low-FODMAP Eating Plan  FODMAP stands for fermentable oligosaccharides, disaccharides, monosaccharides, and polyols. These are sugars that are hard for some people to digest. A low-FODMAP eating plan may help some people who have irritable bowel syndrome (IBS) and certain other bowel (intestinal) diseases to manage their symptoms. This meal plan can be complicated to follow. Work with a diet and nutrition specialist (dietitian) to make a low-FODMAP eating plan that is right for you. A dietitian can helpmake sure that you get enough nutrition from this diet. What are tips for following this plan? Reading food labels Check labels for hidden FODMAPs such as: High-fructose syrup. Honey. Agave. Natural fruit flavors. Onion or garlic powder. Choose low-FODMAP foods that contain 3-4 grams of fiber per serving. Check food labels for serving sizes. Eat only one serving at a time to make sure FODMAP levels stay low. Shopping Shop with a list of foods that are recommended on this diet and make a meal plan. Meal planning Follow a low-FODMAP eating plan for up to 6 weeks, or as told by your health care provider or dietitian. To follow the eating plan: Eliminate high-FODMAP foods from your diet completely. Choose only low-FODMAP foods to eat. You will do this for 2-6 weeks. Gradually reintroduce high-FODMAP foods into your diet one at a time. Most people should wait a few days before introducing the next new high-FODMAP food into their meal plan. Your dietitian can recommend how quickly you may reintroduce foods. Keep a daily record of what and how much you eat and drink. Make note of any  symptoms that you have after eating. Review your daily record with a dietitian regularly to identify which foods you can eat and which foods you should avoid. General tips Drink enough fluid each day to keep your urine pale yellow. Avoid processed foods. These often have added sugar and may be high in FODMAPs. Avoid most dairy products, whole grains, and sweeteners. Work with a dietitian to make sure you get enough fiber in your diet. Avoid high FODMAP foods at meals to manage symptoms. Recommended foods Fruits Bananas, oranges, tangerines, lemons, limes, blueberries, raspberries, strawberries, grapes, cantaloupe, honeydew melon, kiwi, papaya, passion fruit, and pineapple. Limited amounts of dried cranberries, banana chips, and shreddedcoconut. Vegetables Eggplant, zucchini, cucumber, peppers, green beans, bean sprouts, lettuce, arugula, kale, Swiss chard, spinach, collard greens, bok choy, summer squash, potato, and tomato. Limited amounts of corn, carrot, and sweet potato. Greenparts of scallions. Grains Gluten-free grains, such as rice, oats, buckwheat, quinoa, corn, polenta, andmillet. Gluten-free pasta, bread, or cereal. Rice noodles. Corn tortillas. Meats and other proteins Unseasoned beef, pork, poultry, or fish. Eggs. Berniece Salines. Tofu (firm) and tempeh. Limited amounts of nuts and seeds, such as almonds, walnuts, [place] nuts,pecans, peanuts, nut butters, pumpkin seeds, chia seeds, and sunflower seeds. Dairy Lactose-free milk, yogurt, and kefir. Lactose-free cottage cheese and ice cream. Non-dairy milks, such as almond, coconut, hemp, and rice milk. Non-dairy yogurt. Limited amounts of goat cheese, brie, mozzarella, parmesan, swiss, andother hard cheeses. Fats and oils Butter-free spreads. Vegetable oils, such as olive, canola, and sunflower oil. Seasoning and other foods Artificial sweeteners with names that  do not end in "ol," such as aspartame, saccharine, and stevia. Maple syrup, white  table sugar, raw sugar, brown sugar, and molasses. Mayonnaise, soy sauce, and tamari. Fresh basil, coriander,parsley, rosemary, and thyme. Beverages Water and mineral water. Sugar-sweetened soft drinks. Small amounts of orangejuice or cranberry juice. Black and green tea. Most dry wines. Coffee. The items listed above may not be a complete list of foods and beverages you can eat. Contact a dietitian for more information. Foods to avoid Fruits Fresh, dried, and juiced forms of apple, pear, watermelon, peach, plum, cherries, apricots, blackberries, boysenberries, figs, nectarines, and mango.Avocado. Vegetables Chicory root, artichoke, asparagus, cabbage, snow peas, Brussels sprouts, broccoli, sugar snap peas, mushrooms, celery, and cauliflower. Onions, garlic,leeks, and the white part of scallions. Grains Wheat, including kamut, durum, and semolina. Barley and bulgur. Couscous.Wheat-based cereals. Wheat noodles, bread, crackers, and pastries. Meats and other proteins Fried or fatty meat. Sausage. Cashews and pistachios. Soybeans, baked beans, black beans, chickpeas, kidney beans, fava beans, navy beans, lentils,black-eyed peas, and split peas. Dairy Milk, yogurt, ice cream, and soft cheese. Cream and sour cream. Milk-basedsauces. Custard. Buttermilk. Soy milk. Seasoning and other foods Any sugar-free gum or candy. Foods that contain artificial sweeteners such as sorbitol, mannitol, isomalt, or xylitol. Foods that contain honey, high-fructose corn syrup, or agave. Bouillon, vegetable stock, beef stock, and chicken stock. Garlic and onion powder. Condiments made with onion, such ashummus, chutney, pickles, relish, salad dressing, and salsa. Tomato paste. Beverages Chicory-based drinks. Coffee substitutes. Chamomile tea. Fennel tea. Sweet or fortified wines such as port or sherry. Diet soft drinks made with isomalt, mannitol, maltitol, sorbitol, or xylitol. Apple, pear, and mango juice. Juiceswith  high-fructose corn syrup. The items listed above may not be a complete list of foods and beverages you should avoid. Contact a dietitian for more information. Summary FODMAP stands for fermentable oligosaccharides, disaccharides, monosaccharides, and polyols. These are sugars that are hard for some people to digest. A low-FODMAP eating plan is a short-term diet that helps to ease symptoms of certain bowel diseases. The eating plan usually lasts up to 6 weeks. After that, high-FODMAP foods are reintroduced gradually and one at a time. This can help you find out which foods may be causing symptoms. A low-FODMAP eating plan can be complicated. It is best to work with a dietitian who has experience with this type of plan. This information is not intended to replace advice given to you by your health care provider. Make sure you discuss any questions you have with your healthcare provider. Document Revised: 11/07/2019 Document Reviewed: 11/07/2019 Elsevier Patient Education  [place].

## 2021-01-08 NOTE — Progress Notes (Signed)
Patient ID: Don Patterson, male    DOB: Nov 12, 1970, 50 y.o.   MRN: [id number]  PCP: Delsa Grana, PA-C  Chief Complaint  Patient presents with   GI Problem    Patient states having a lot of gas, bloating, changes in bowel habits.  Had couple of episodes of mucous and blood in bowel movement.  States he was scheduled for colonoscopy but insurance would not cover b/c it was diagnostic instead of screening?    Subjective:   Don Patterson is a 50 y.o. 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  Frequent food and stress triggers He's tried avoiding dairy without much improvement Currently stools are normal and mostly formed, sometimes a little firm to slightly soft, regular DM, no melena, hematochezia, N/V weight changes, fatigue  Needs referral to GI for OV they would not see him for his screening colonoscopy due to recent sx  Patient Active Problem List   Diagnosis Date Noted   Allergic rhinitis 11/20/2019   Obesity (BMI 30.0-34.9) 05/17/2018   Hyperlipidemia 05/18/2016   Migraine aura without headache 01/14/2015   Gastroesophageal reflux disease without esophagitis 01/14/2015      Current Outpatient Medications:    atorvastatin (LIPITOR) 20 MG tablet, TAKE 1 TABLET BY MOUTH EVERYDAY AT BEDTIME, Disp: 90 tablet, Rfl: 3   Flaxseed, Linseed, (FLAX SEEDS PO), Take by mouth daily., Disp: , Rfl:    GuaiFENesin (MUCINEX PO), Take as needed by mouth., Disp: , Rfl:    levocetirizine (XYZAL) 5 MG tablet, TAKE 1 TABLET (5 MG TOTAL) BY MOUTH DAILY AS NEEDED FOR ALLERGIES., Disp: 90 tablet, Rfl: 3   montelukast (SINGULAIR) 10 MG tablet, TAKE 1 TABLET BY MOUTH EVERYDAY AT BEDTIME, Disp: 90 tablet, Rfl: 3   Omega-3 Fatty Acids (FISH OIL PO), Take by mouth daily., Disp:  , Rfl:    pantoprazole (PROTONIX) 20 MG tablet, TAKE 1 TABLET (20 MG TOTAL) BY MOUTH DAILY. ON AN EMPTY STOMACH, Disp: 90 tablet, Rfl: 3   sodium chloride (OCEAN) 0.65 % SOLN nasal spray, Place 1 spray into both nostrils as needed for congestion., Disp: , Rfl:    triamcinolone (NASACORT) 55 MCG/ACT AERO nasal inhaler, Place 2 sprays daily into the nose. , Disp: , Rfl:    Allergies  Allergen Reactions   Cat Hair Extract    Dust Mite Extract Other (See Comments)   Tree Extract      Social History   Tobacco Use   Smoking status: Never   Smokeless tobacco: Never  Vaping Use   Vaping Use: Never used  Substance Use Topics   Alcohol use: Yes    Alcohol/week: 0.0 standard drinks    Comment: rarely   Drug use: No      Chart Review Today: I personally reviewed active problem list, medication list, allergies, family history, social history, health maintenance, notes from last encounter, lab results, imaging with the patient/caregiver today.   Review of Systems  Constitutional: Negative.  Negative for activity change, appetite change, chills, diaphoresis, fatigue and fever.  HENT: Negative.    Eyes: Negative.   Respiratory: Negative.    Cardiovascular: Negative.   Gastrointestinal: Negative.   Endocrine: Negative.   Genitourinary: Negative.   Musculoskeletal: Negative.   Skin: Negative.  Allergic/Immunologic: Negative.   Neurological: Negative.   Hematological: Negative.   Psychiatric/Behavioral: Negative.    All other systems reviewed and are negative.     Objective:   Vitals:   01/08/21 0940  BP: 128/74  Pulse: 60  Resp: 16  Temp: 98.1 F (36.7 C)  SpO2: 99%  Weight: 185 lb 12.8 oz (84.3 kg)  Height: 5\' 8"  (1.727 m)    Body mass index is 28.25 kg/m.  Physical Exam Vitals and nursing note reviewed.  Constitutional:      General: He is not in acute distress.    Appearance: Normal appearance. He is well-developed, well-groomed and overweight. He is not  ill-appearing, toxic-appearing or diaphoretic.     Interventions: Face mask in place.  HENT:     Head: Normocephalic and atraumatic.     Jaw: No trismus.     Right Ear: External ear normal.     Left Ear: External ear normal.     Nose: Nose normal. No congestion.     Mouth/Throat:     Pharynx: No oropharyngeal exudate.  Eyes:     General: Lids are normal. No scleral icterus.       Right eye: No discharge.        Left eye: No discharge.     Conjunctiva/sclera: Conjunctivae normal.  Neck:     Trachea: Trachea and phonation normal. No tracheal deviation.  Cardiovascular:     Rate and Rhythm: Normal rate and regular rhythm.     Pulses: Normal pulses.          Radial pulses are 2+ on the right side and 2+ on the left side.       Posterior tibial pulses are 2+ on the right side and 2+ on the left side.     Heart sounds: Normal heart sounds. No murmur heard.   No friction rub. No gallop.  Pulmonary:     Effort: Pulmonary effort is normal. No respiratory distress.     Breath sounds: Normal breath sounds. No stridor. No wheezing, rhonchi or rales.  Abdominal:     General: Bowel sounds are normal. There is no distension.     Palpations: Abdomen is soft. There is no hepatomegaly, splenomegaly, mass or pulsatile mass.     Tenderness: There is no abdominal tenderness. There is no right CVA tenderness, left CVA tenderness, guarding or rebound. Negative signs include Murphy's sign and McBurney's sign.     Hernia: No hernia is present.  Musculoskeletal:     Right lower leg: No edema.     Left lower leg: No edema.  Skin:    General: Skin is warm and dry.     Coloration: Skin is not jaundiced.     Findings: No rash.     Nails: There is no clubbing.  Neurological:     Mental Status: He is alert. Mental status is at baseline.     Cranial Nerves: No dysarthria or facial asymmetry.     Motor: No tremor or abnormal muscle tone.     Gait: Gait normal.  Psychiatric:        Mood and Affect: Mood  normal.        Speech: Speech normal.        Behavior: Behavior normal. Behavior is cooperative.     Results for orders placed or performed in visit on 05/20/20  CBC with Differential/Platelet  Result Value Ref Range   WBC 5.9 3.8 - 10.8 Thousand/uL   RBC 5.22 4.20 -  5.80 Million/uL   Hemoglobin 16.0 13.2 - 17.1 g/dL   HCT 47.4 38.5 - 50.0 %   MCV 90.8 80.0 - 100.0 fL   MCH 30.7 27.0 - 33.0 pg   MCHC 33.8 32.0 - 36.0 g/dL   RDW 12.4 11.0 - 15.0 %   Platelets 240 140 - 400 Thousand/uL   MPV 10.7 7.5 - 12.5 fL   Neutro Abs 3,416 1,500 - 7,800 cells/uL   Lymphs Abs 1,876 850 - 3,900 cells/uL   Absolute Monocytes 437 200 - 950 cells/uL   Eosinophils Absolute 89 15 - 500 cells/uL   Basophils Absolute 83 0 - 200 cells/uL   Neutrophils Relative % 57.9 %   Total Lymphocyte 31.8 %   Monocytes Relative 7.4 %   Eosinophils Relative 1.5 %   Basophils Relative 1.4 %  COMPLETE METABOLIC PANEL WITH GFR  Result Value Ref Range   Glucose, Bld 95 65 - 99 mg/dL   BUN 9 7 - 25 mg/dL   Creat 1.09 0.60 - 1.35 mg/dL   GFR, Est Non African American 80 > OR = 60 mL/min/1.37m2   GFR, Est African American 93 > OR = 60 mL/min/1.68m2   BUN/Creatinine Ratio NOT APPLICABLE 6 - 22 (calc)   Sodium 139 135 - 146 mmol/L   Potassium 4.3 3.5 - 5.3 mmol/L   Chloride 101 98 - 110 mmol/L   CO2 29 20 - 32 mmol/L   Calcium 9.9 8.6 - 10.3 mg/dL   Total Protein 7.0 6.1 - 8.1 g/dL   Albumin 4.7 3.6 - 5.1 g/dL   Globulin 2.3 1.9 - 3.7 g/dL (calc)   AG Ratio 2.0 1.0 - 2.5 (calc)   Total Bilirubin 1.0 0.2 - 1.2 mg/dL   Alkaline phosphatase (APISO) 66 36 - 130 U/L   AST 22 10 - 40 U/L   ALT 35 9 - 46 U/L  Lipid panel  Result Value Ref Range   Cholesterol 164 <200 mg/dL   HDL 38 (L) > OR = 40 mg/dL   Triglycerides 175 (H) <150 mg/dL   LDL Cholesterol (Calc) 99 mg/dL (calc)   Total CHOL/HDL Ratio 4.3 <5.0 (calc)   Non-HDL Cholesterol (Calc) 126 <130 mg/dL (calc)  Hepatitis C antibody  Result Value Ref Range    Hepatitis C Ab NON-REACTIVE NON-REACTI   SIGNAL TO CUT-OFF 0.00 <1.00       Assessment & Plan:     ICD-10-CM   1. Abdominal pain, unspecified abdominal location  R10.9 Ambulatory referral to Gastroenterology    CBC with Differential/Platelet    COMPLETE METABOLIC PANEL WITH GFR    Celiac Disease Panel    Hepatitis panel, acute    Fecal Globin By Immunochemistry    CANCELED: H. pylori breath test   generalized abd discomfort, bloating and sensitivity, seems to correlate with worsening stress towards end of school year - IBS?     2. BRBPR (bright red blood per rectum)  K62.5 Ambulatory referral to Gastroenterology    CBC with Differential/Platelet    COMPLETE METABOLIC PANEL WITH GFR    Celiac Disease Panel    Fecal Globin By Immunochemistry    CANCELED: H. pylori breath test   resolved over a month ago, r/o anemia and check stool cards, may have been acute GI illness? or mild diverticular flare?     3. Screening for HIV without presence of risk factors  Z11.4 HIV Antibody (routine testing w rflx)    4. Screening for malignant neoplasm of colon  Z12.11 Fecal Globin By Immunochemistry     Suspect possible IBS - reviewed IBS diet, FOD MAP foods, keeping food journal etc F/up GI Hx of gerd on PPI so H pylori breath test was cancelled  Abd benign, pt well appearing Handouts given, discussed work up and plan with pt who was in agreement. Still likely need colonscopy - but will need after office consult     Delsa Grana, PA-C 01/08/21 10:03 AM

## 2021-01-11 LAB — CBC WITH DIFFERENTIAL/PLATELET
Absolute Monocytes: 353 cells/uL (ref 200–950)
Basophils Absolute: 78 cells/uL (ref 0–200)
Basophils Relative: 1.6 %
Eosinophils Absolute: 78 cells/uL (ref 15–500)
Eosinophils Relative: 1.6 %
HCT: 48.1 % (ref 38.5–50.0)
Hemoglobin: 16.1 g/dL (ref 13.2–17.1)
Lymphs Abs: 1431 cells/uL (ref 850–3900)
MCH: 30.3 pg (ref 27.0–33.0)
MCHC: 33.5 g/dL (ref 32.0–36.0)
MCV: 90.6 fL (ref 80.0–100.0)
MPV: 10.7 fL (ref 7.5–12.5)
Monocytes Relative: 7.2 %
Neutro Abs: 2960 cells/uL (ref 1500–7800)
Neutrophils Relative %: 60.4 %
Platelets: 225 10*3/uL (ref 140–400)
RBC: 5.31 10*6/uL (ref 4.20–5.80)
RDW: 12.8 % (ref 11.0–15.0)
Total Lymphocyte: 29.2 %
WBC: 4.9 10*3/uL (ref 3.8–10.8)

## 2021-01-11 LAB — CELIAC DISEASE PANEL
(tTG) Ab, IgA: <1 U/mL
(tTG) Ab, IgG: <1 U/mL
Gliadin IgA: <1 U/mL
Gliadin IgG: <1 U/mL
Immunoglobulin A: 106 mg/dL (ref 47–310)

## 2021-01-11 LAB — COMPLETE METABOLIC PANEL WITH GFR
AG Ratio: 2.2 (calc) (ref 1.0–2.5)
ALT: 43 U/L (ref 9–46)
AST: 22 U/L (ref 10–40)
Albumin: 5 g/dL (ref 3.6–5.1)
Alkaline phosphatase (APISO): 69 U/L (ref 36–130)
BUN: 10 mg/dL (ref 7–25)
CO2: 29 mmol/L (ref 20–32)
Calcium: 9.9 mg/dL (ref 8.6–10.3)
Chloride: 101 mmol/L (ref 98–110)
Creat: 1.08 mg/dL (ref 0.60–1.35)
GFR, Est African American: 94 mL/min/{1.73_m2} (ref >=60)
GFR, Est Non African American: 81 mL/min/{1.73_m2} (ref >=60)
Globulin: 2.3 g/dL (calc) (ref 1.9–3.7)
Glucose, Bld: 96 mg/dL (ref 65–99)
Potassium: 4 mmol/L (ref 3.5–5.3)
Sodium: 142 mmol/L (ref 135–146)
Total Bilirubin: 1.1 mg/dL (ref 0.2–1.2)
Total Protein: 7.3 g/dL (ref 6.1–8.1)

## 2021-01-11 LAB — HEPATITIS PANEL, ACUTE
Hep A IgM: NONREACTIVE
Hep B C IgM: NONREACTIVE
Hepatitis B Surface Ag: NONREACTIVE
Hepatitis C Ab: NONREACTIVE
SIGNAL TO CUT-OFF: 0 (ref <1.00)

## 2021-01-11 LAB — HIV ANTIBODY (ROUTINE TESTING W REFLEX): HIV 1&2 Ab, 4th Generation: NONREACTIVE

## 2021-01-12 LAB — FECAL GLOBIN BY IMMUNOCHEMISTRY
FECAL GLOBIN RESULT:: NOT DETECTED
MICRO NUMBER:: 12103249
SPECIMEN QUALITY:: ADEQUATE

## 2021-03-17 ENCOUNTER — Encounter: Payer: Self-pay | Admitting: Gastroenterology

## 2021-03-17 ENCOUNTER — Ambulatory Visit: Payer: BC Managed Care – PPO | Admitting: Gastroenterology

## 2021-03-17 ENCOUNTER — Other Ambulatory Visit: Payer: Self-pay

## 2021-03-17 VITALS — BP 161/95 | HR 71 | Temp 98.0°F | Ht 68.0 in | Wt 187.8 lb

## 2021-03-17 DIAGNOSIS — K625 Hemorrhage of anus and rectum: Secondary | ICD-10-CM

## 2021-03-17 DIAGNOSIS — R140 Abdominal distension (gaseous): Secondary | ICD-10-CM | POA: Diagnosis not present

## 2021-03-17 DIAGNOSIS — R1013 Epigastric pain: Secondary | ICD-10-CM | POA: Diagnosis not present

## 2021-03-17 MED ORDER — CLENPIQ 10-3.5-12 MG-GM -GM/160ML PO SOLN: 1.0000 | ORAL | 0 refills | Status: DC

## 2021-03-17 NOTE — Patient Instructions (Signed)
Givin I begaurd samples please call us if they help and we will send in a prescription

## 2021-03-17 NOTE — Progress Notes (Signed)
Don Darby, MD [address]  [place]  [place], [place] [address]  Main: [phone]  Fax: [phone]    Gastroenterology Consultation  Referring Provider:     Delsa Grana, PA-C Primary Care Physician:  Don Grana, PA-C Primary Gastroenterologist:  Dr. Cephas Patterson Reason for Consultation:     Abdominal bloating, blood in stool        HPI:   Don Patterson is a 50 y.o. male referred by Dr. Delsa Grana, PA-C  for consultation & management of abdominal bloating, blood in stool.  Patient reports several months history of abdominal bloating, gas with bowel movements of variable consistency.  He thought his symptoms are stress related.  He also had 1 episode of rectal pain with pressure and bright red blood per rectum.  This was last year.  He does not feel normal in his stomach.  He has been avoiding lactose and maintains a food diary.  He noticed that broccoli and cabbage resulted in worsening of abdominal bloating.  His labs including CBC, CMP, celiac panel came back unremarkable.  Patient does not smoke or drink alcohol.  He drinks mostly water.  He is a Environmental consultant at [employer]  He has history of postnasal drip, takes Protonix 20 mg at bedtime  NSAIDs: None  Antiplts/Anticoagulants/Anti thrombotics: None  GI Procedures: None Paternal grandfather passed away from colon cancer  Past Medical History:  Diagnosis Date   Allergy    Bilateral shoulder pain 01/14/2015   Colitis    History of kidney stones    Hyperlipidemia LDL goal <130 05/18/2016   Start statin Nov 2017   Medication monitoring encounter 05/18/2016   Migraine    Palpitations 03/29/2015   Sinus congestion     Past Surgical History:  Procedure Laterality Date   CYST REMOVAL NECK Left 1999    Current Outpatient Medications:    atorvastatin (LIPITOR) 20 MG tablet, TAKE 1 TABLET BY MOUTH EVERYDAY AT BEDTIME, Disp: 90 tablet, Rfl: 3   Flaxseed, Linseed, (FLAX SEEDS PO), Take  by mouth daily., Disp: , Rfl:    GuaiFENesin (MUCINEX PO), Take as needed by mouth., Disp: , Rfl:    levocetirizine (XYZAL) 5 MG tablet, TAKE 1 TABLET (5 MG TOTAL) BY MOUTH DAILY AS NEEDED FOR ALLERGIES., Disp: 90 tablet, Rfl: 3   montelukast (SINGULAIR) 10 MG tablet, TAKE 1 TABLET BY MOUTH EVERYDAY AT BEDTIME, Disp: 90 tablet, Rfl: 3   Omega-3 Fatty Acids (FISH OIL PO), Take by mouth daily., Disp: , Rfl:    pantoprazole (PROTONIX) 20 MG tablet, TAKE 1 TABLET (20 MG TOTAL) BY MOUTH DAILY. ON AN EMPTY STOMACH, Disp: 90 tablet, Rfl: 3   sodium chloride (OCEAN) 0.65 % SOLN nasal spray, Place 1 spray into both nostrils as needed for congestion., Disp: , Rfl:    triamcinolone (NASACORT) 55 MCG/ACT AERO nasal inhaler, Place 2 sprays daily into the nose. , Disp: , Rfl:     Family History  Problem Relation Age of Onset   Cancer Mother        breast and ovarian   Heart disease Father    Heart disease Paternal Uncle      Social History   Tobacco Use   Smoking status: Never   Smokeless tobacco: Never  Vaping Use   Vaping Use: Never used  Substance Use Topics   Alcohol use: Yes    Alcohol/week: 0.0 standard drinks    Comment: rarely   Drug use:  No    Allergies as of 03/17/2021 - Review Complete 03/17/2021  Allergen Reaction Noted   Cat hair extract     Dust mite extract Other (See Comments)    Tree extract      Review of Systems:    All systems reviewed and negative except where noted in HPI.   Physical Exam:  BP (!) 161/95 (BP Location: Left Arm, Patient Position: Sitting, Cuff Size: Normal)   Pulse 71   Temp 98 F (36.7 C) (Oral)   Ht '5\' 8"'$  (1.727 m)   Wt 187 lb 12.8 oz (85.2 kg)   BMI 28.55 kg/m  No LMP for male patient.  General:   Alert,  Well-developed, well-nourished, pleasant and cooperative in NAD Head:  Normocephalic and atraumatic. Eyes:  Sclera clear, no icterus.   Conjunctiva pink. Ears:  Normal auditory acuity. Nose:  No deformity, discharge, or  lesions. Mouth:  No deformity or lesions,oropharynx pink & moist. Neck:  Supple; no masses or thyromegaly. Lungs:  Respirations even and unlabored.  Clear throughout to auscultation.   No wheezes, crackles, or rhonchi. No acute distress. Heart:  Regular rate and rhythm; no murmurs, clicks, rubs, or gallops. Abdomen:  Normal bowel sounds. Soft, non-tender and non-distended without masses, hepatosplenomegaly or hernias noted.  No guarding or rebound tenderness.   Rectal: Not performed Msk:  Symmetrical without gross deformities. Good, equal movement & strength bilaterally. Pulses:  Normal pulses noted. Extremities:  No clubbing or edema.  No cyanosis. Neurologic:  Alert and oriented x3;  grossly normal neurologically. Skin:  Intact without significant lesions or rashes. No jaundice. Psych:  Alert and cooperative. Normal mood and affect.  Imaging Studies: No abdominal imaging  Assessment and Plan:   Don Patterson is a 50 y.o. pleasant Caucasian male with no significant past medical history is seen in consultation for chronic symptoms of dyspepsia, abdominal bloating, gas, change in bowel habits, and episode of bright red blood per rectum  Recommend EGD and colonoscopy for further evaluation Trial of IBgard, samples provided   Follow up in 4 months   Don Darby, MD

## 2021-04-11 ENCOUNTER — Encounter: Payer: Self-pay | Admitting: Gastroenterology

## 2021-04-19 ENCOUNTER — Other Ambulatory Visit: Payer: Self-pay

## 2021-04-19 ENCOUNTER — Encounter
Admission: RE | Disposition: A | Discharge status: Home / Self Care | Payer: Self-pay | Source: Ambulatory Visit | Attending: Gastroenterology

## 2021-04-19 ENCOUNTER — Ambulatory Visit: Payer: BC Managed Care – PPO | Admitting: Anesthesiology

## 2021-04-19 ENCOUNTER — Encounter: Payer: Self-pay | Admitting: Gastroenterology

## 2021-04-19 ENCOUNTER — Ambulatory Visit
Admission: RE | Admit: 2021-04-19 | Discharge: 2021-04-19 | Disposition: A | Discharge status: Home / Self Care | Payer: BC Managed Care – PPO | Source: Ambulatory Visit | Attending: Gastroenterology | Admitting: Gastroenterology

## 2021-04-19 DIAGNOSIS — K644 Residual hemorrhoidal skin tags: Secondary | ICD-10-CM | POA: Diagnosis not present

## 2021-04-19 DIAGNOSIS — K625 Hemorrhage of anus and rectum: Secondary | ICD-10-CM | POA: Diagnosis not present

## 2021-04-19 DIAGNOSIS — K5730 Diverticulosis of large intestine without perforation or abscess without bleeding: Secondary | ICD-10-CM | POA: Insufficient documentation

## 2021-04-19 DIAGNOSIS — K259 Gastric ulcer, unspecified as acute or chronic, without hemorrhage or perforation: Secondary | ICD-10-CM | POA: Insufficient documentation

## 2021-04-19 DIAGNOSIS — Z803 Family history of malignant neoplasm of breast: Secondary | ICD-10-CM | POA: Insufficient documentation

## 2021-04-19 DIAGNOSIS — Z8249 Family history of ischemic heart disease and other diseases of the circulatory system: Secondary | ICD-10-CM | POA: Insufficient documentation

## 2021-04-19 DIAGNOSIS — Z8041 Family history of malignant neoplasm of ovary: Secondary | ICD-10-CM | POA: Insufficient documentation

## 2021-04-19 DIAGNOSIS — K635 Polyp of colon: Secondary | ICD-10-CM

## 2021-04-19 DIAGNOSIS — R140 Abdominal distension (gaseous): Secondary | ICD-10-CM | POA: Diagnosis not present

## 2021-04-19 DIAGNOSIS — Z9109 Other allergy status, other than to drugs and biological substances: Secondary | ICD-10-CM | POA: Insufficient documentation

## 2021-04-19 DIAGNOSIS — R1013 Epigastric pain: Secondary | ICD-10-CM

## 2021-04-19 DIAGNOSIS — D120 Benign neoplasm of cecum: Secondary | ICD-10-CM | POA: Diagnosis not present

## 2021-04-19 DIAGNOSIS — K317 Polyp of stomach and duodenum: Secondary | ICD-10-CM | POA: Insufficient documentation

## 2021-04-19 DIAGNOSIS — Z79899 Other long term (current) drug therapy: Secondary | ICD-10-CM | POA: Insufficient documentation

## 2021-04-19 HISTORY — PX: ESOPHAGOGASTRODUODENOSCOPY: SHX5428

## 2021-04-19 HISTORY — DX: Essential (primary) hypertension: I10

## 2021-04-19 HISTORY — PX: COLONOSCOPY WITH PROPOFOL: SHX5780

## 2021-04-19 SURGERY — COLONOSCOPY WITH PROPOFOL
Anesthesia: General

## 2021-04-19 MED ORDER — PROPOFOL 500 MG/50ML IV EMUL
INTRAVENOUS | Status: DC | PRN
  Administered 2021-04-19: 200 ug/kg/min via INTRAVENOUS

## 2021-04-19 MED ORDER — PROPOFOL 10 MG/ML IV BOLUS
INTRAVENOUS | Status: DC | PRN
  Administered 2021-04-19: 100 mg via INTRAVENOUS

## 2021-04-19 MED ORDER — LIDOCAINE 2% (20 MG/ML) 5 ML SYRINGE
INTRAMUSCULAR | Status: DC | PRN
  Administered 2021-04-19: 100 mg via INTRAVENOUS

## 2021-04-19 MED ORDER — SODIUM CHLORIDE 0.9 % IV SOLN: INTRAVENOUS | Status: DC

## 2021-04-19 MED ORDER — GLYCOPYRROLATE 0.2 MG/ML IJ SOLN
INTRAMUSCULAR | Status: DC | PRN
  Administered 2021-04-19: .2 mg via INTRAVENOUS

## 2021-04-19 MED ORDER — LIDOCAINE HCL (PF) 2 % IJ SOLN
INTRAMUSCULAR | Status: AC
  Filled 2021-04-19: qty 5

## 2021-04-19 MED ORDER — PROPOFOL 500 MG/50ML IV EMUL
INTRAVENOUS | Status: AC
  Filled 2021-04-19: qty 50

## 2021-04-19 MED ORDER — GLYCOPYRROLATE 0.2 MG/ML IJ SOLN
INTRAMUSCULAR | Status: AC
  Filled 2021-04-19: qty 1

## 2021-04-19 MED ORDER — CEFAZOLIN SODIUM-DEXTROSE 2-4 GM/100ML-% IV SOLN
INTRAVENOUS | Status: AC
  Filled 2021-04-19: qty 100

## 2021-04-19 NOTE — Transfer of Care (Signed)
Immediate Anesthesia Transfer of Care Note  Patient: SHIA EBER  Procedure(s) Performed: COLONOSCOPY WITH PROPOFOL ESOPHAGOGASTRODUODENOSCOPY (EGD)  Patient Location: Endoscopy Unit  Anesthesia Type:General  Level of Consciousness: sedated  Airway & Oxygen Therapy: Patient Spontanous Breathing  Post-op Assessment: Report given to RN and Post -op Vital signs reviewed and stable  Post vital signs: Reviewed and stable  Last Vitals:  Vitals Value Taken Time  BP 105/68 04/19/21 0911  Temp 36.4 C 04/19/21 0911  Pulse 59 04/19/21 0912  Resp 18 04/19/21 0912  SpO2 100 % 04/19/21 0912  Vitals shown include unvalidated device data.  Last Pain:  Vitals:   04/19/21 0911  TempSrc: Temporal  PainSc: Asleep         Complications: No notable events documented.

## 2021-04-19 NOTE — Op Note (Signed)
[facility] Gastroenterology Patient Name: Don Patterson Procedure Date: 04/19/2021 8:31 AM MRN: [id number] Account #: [id number] Date of Birth: 08/18/1970 Admit Type: Outpatient Age: 50 Room: [facility] ENDO ROOM 1 Gender: Male Note Status: Finalized Instrument Name: Colonscope [id number] Procedure:             Colonoscopy Indications:           This is the patient's first colonoscopy, Rectal                         bleeding Providers:             Lin Landsman MD, MD Medicines:             General Anesthesia Complications:         No immediate complications. Estimated blood loss: None. Procedure:             Pre-Anesthesia Assessment:                        - Prior to the procedure, a History and Physical was                         performed, and patient medications and allergies were                         reviewed. The patient is competent. The risks and                         benefits of the procedure and the sedation options and                         risks were discussed with the patient. All questions                         were answered and informed consent was obtained.                         Patient identification and proposed procedure were                         verified by the physician, the nurse, the                         anesthesiologist, the anesthetist and the technician                         in the pre-procedure area in the procedure room in the                         endoscopy suite. Mental Status Examination: alert and                         oriented. Airway Examination: normal oropharyngeal                         airway and neck mobility. Respiratory Examination:                         clear to auscultation. CV Examination: normal.  Prophylactic Antibiotics: The patient does not require                         prophylactic antibiotics. Prior Anticoagulants: The                         patient has taken  no previous anticoagulant or                         antiplatelet agents. ASA Grade Assessment: II - A                         patient with mild systemic disease. After reviewing                         the risks and benefits, the patient was deemed in                         satisfactory condition to undergo the procedure. The                         anesthesia plan was to use general anesthesia.                         Immediately prior to administration of medications,                         the patient was re-assessed for adequacy to receive                         sedatives. The heart rate, respiratory rate, oxygen                         saturations, blood pressure, adequacy of pulmonary                         ventilation, and response to care were monitored                         throughout the procedure. The physical status of the                         patient was re-assessed after the procedure.                        After obtaining informed consent, the colonoscope was                         passed under direct vision. Throughout the procedure,                         the patient's blood pressure, pulse, and oxygen                         saturations were monitored continuously. The                         Colonoscope was introduced through the anus and  advanced to the the cecum, identified by the                         appendiceal orifice, ileocecal valve and palpation.                         The colonoscopy was performed without difficulty. The                         patient tolerated the procedure well. The quality of                         the bowel preparation was evaluated using the BBPS                         [facility] Bowel Preparation Scale) with scores of: Right                         Colon = 3, Transverse Colon = 3 and Left Colon = 3                         (entire mucosa seen well with no residual staining,                          small fragments of stool or opaque liquid). The total                         BBPS score equals 9. Findings:      The perianal and digital rectal examinations were normal. Pertinent       negatives include normal sphincter tone and no palpable rectal lesions.      A 5 mm polyp was found in the cecumnear appendiceal orifice. The polyp       was sessile. The polyp was removed with a cold snare. Resection and       retrieval were complete.      Multiple diverticula were found in the sigmoid colon.      Non-bleeding external hemorrhoids were found during retroflexion. The       hemorrhoids were medium-sized.      The exam was otherwise without abnormality. Impression:            - One 5 mm polyp in the cecum, removed with a cold                         snare. Resected and retrieved.                        - Diverticulosis in the sigmoid colon.                        - Non-bleeding external hemorrhoids.                        - The examination was otherwise normal. Recommendation:        - Discharge patient to home (with escort).                        - Resume previous diet today.                        -  Continue present medications.                        - Await pathology results.                        - Repeat colonoscopy in 7-10 years for surveillance                         based on pathology results. Procedure Code(s):     --- Professional ---                        [phone], Colonoscopy, flexible; with removal of                         tumor(s), polyp(s), or other lesion(s) by snare                         technique Diagnosis Code(s):     --- Professional ---                        K63.5, Polyp of colon                        K64.4, Residual hemorrhoidal skin tags                        K62.5, Hemorrhage of anus and rectum                        K57.30, Diverticulosis of large intestine without                         perforation or abscess without bleeding CPT copyright 2019  American Medical Association. All rights reserved. The codes documented in this report are preliminary and upon coder review may  be revised to meet current compliance requirements. Dr. Ulyess Mort Lin Landsman MD, MD 04/19/2021 9:08:44 AM This report has been signed electronically. Number of Addenda: 0 Note Initiated On: 04/19/2021 8:31 AM Scope Withdrawal Time: 0 hours 6 minutes 53 seconds  Total Procedure Duration: 0 hours 8 minutes 26 seconds  Estimated Blood Loss:  Estimated blood loss: none.      [facility]

## 2021-04-19 NOTE — Op Note (Signed)
[facility] Gastroenterology Patient Name: Don Patterson Procedure Date: 04/19/2021 8:32 AM MRN: [id number] Account #: [id number] Date of Birth: 02-21-1971 Admit Type: Outpatient Age: 50 Room: [facility] ENDO ROOM 1 Gender: Male Note Status: Finalized Instrument Name: Upper Endoscope [id number] Procedure:             Upper GI endoscopy Indications:           Dyspepsia Providers:             Lin Landsman MD, MD Medicines:             General Anesthesia Complications:         No immediate complications. Estimated blood loss: None. Procedure:             Pre-Anesthesia Assessment:                        - Prior to the procedure, a History and Physical was                         performed, and patient medications and allergies were                         reviewed. The patient is competent. The risks and                         benefits of the procedure and the sedation options and                         risks were discussed with the patient. All questions                         were answered and informed consent was obtained.                         Patient identification and proposed procedure were                         verified by the physician, the nurse, the                         anesthesiologist, the anesthetist and the technician                         in the pre-procedure area in the procedure room in the                         endoscopy suite. Mental Status Examination: alert and                         oriented. Airway Examination: normal oropharyngeal                         airway and neck mobility. Respiratory Examination:                         clear to auscultation. CV Examination: normal.                         Prophylactic Antibiotics: The patient  does not require                         prophylactic antibiotics. Prior Anticoagulants: The                         patient has taken no previous anticoagulant or                          antiplatelet agents. ASA Grade Assessment: II - A                         patient with mild systemic disease. After reviewing                         the risks and benefits, the patient was deemed in                         satisfactory condition to undergo the procedure. The                         anesthesia plan was to use general anesthesia.                         Immediately prior to administration of medications,                         the patient was re-assessed for adequacy to receive                         sedatives. The heart rate, respiratory rate, oxygen                         saturations, blood pressure, adequacy of pulmonary                         ventilation, and response to care were monitored                         throughout the procedure. The physical status of the                         patient was re-assessed after the procedure.                        After obtaining informed consent, the endoscope was                         passed under direct vision. Throughout the procedure,                         the patient's blood pressure, pulse, and oxygen                         saturations were monitored continuously. The                         Endosonoscope was introduced through the mouth, and  advanced to the second part of duodenum. The upper GI                         endoscopy was accomplished without difficulty. The                         patient tolerated the procedure well. Findings:      The duodenal bulb and second portion of the duodenum were normal.       Biopsies were taken with a cold forceps for histology.      Multiple diminutive sessile hyperplastic polyps with no bleeding and no       stigmata of recent bleeding were found in the gastric body.      A few dispersed diminutive erosions with no bleeding and no stigmata of       recent bleeding were found in the gastric antrum. Biopsies were taken       with a cold forceps for  Helicobacter pylori testing from body and antrum.      The cardia and gastric fundus were normal on retroflexion.      Mucosal changes including white plaques and crepe paper esophagus were       found in the entire esophagus. Esophageal findings were graded using the       Eosinophilic Esophagitis Endoscopic Reference Score (EoE-EREFS) as:       Edema Grade 1 Present (decreased clarity or absence of vascular       markings), Rings Grade 0 None (no ridges or rings seen), Exudates Grade       1 Mild (scattered white lesions involving less than 10 percent of the       esophageal surface area), Furrows Grade 0 None (no vertical lines seen)       and Stricture none (no stricture found). Biopsies were taken with a cold       forceps for histology.      2 areas of ectopic gastric mucosa were found in the upper third of the       esophagus, 20 cm from the incisors. Impression:            - Normal duodenal bulb and second portion of the                         duodenum. Biopsied.                        - Multiple gastric polyps.                        - Erosive gastropathy with no bleeding and no stigmata                         of recent bleeding. Biopsied.                        - Esophageal mucosal changes suspicious for                         eosinophilic esophagitis. Biopsied.                        - Ectopic gastric mucosa in the upper third of the  esophagus. Recommendation:        - Await pathology results.                        - Return to my office as previously scheduled.                        - Proceed with colonoscopy as scheduled                        See colonoscopy report Procedure Code(s):     --- Professional ---                        [phone], Esophagogastroduodenoscopy, flexible,                         transoral; with biopsy, single or multiple Diagnosis Code(s):     --- Professional ---                        K31.7, Polyp of stomach and duodenum                         K31.89, Other diseases of stomach and duodenum                        K22.8, Other specified diseases of esophagus                        R10.13, Epigastric pain CPT copyright 2019 American Medical Association. All rights reserved. The codes documented in this report are preliminary and upon coder review may  be revised to meet current compliance requirements. Dr. Ulyess Mort Lin Landsman MD, MD 04/19/2021 8:56:45 AM This report has been signed electronically. Number of Addenda: 0 Note Initiated On: 04/19/2021 8:32 AM Estimated Blood Loss:  Estimated blood loss: none.      [facility]

## 2021-04-19 NOTE — Anesthesia Preprocedure Evaluation (Signed)
Anesthesia Evaluation  Patient identified by MRN, date of birth, ID band Patient awake    Reviewed: Allergy & Precautions, NPO status , Patient's Chart, lab work & pertinent test results  Airway Mallampati: II  TM Distance: >3 FB Neck ROM: Full    Dental no notable dental hx.    Pulmonary neg pulmonary ROS,    Pulmonary exam normal        Cardiovascular hypertension (Pt Denies, No Meds), Normal cardiovascular exam     Neuro/Psych  Headaches, negative psych ROS   GI/Hepatic Neg liver ROS, Bowel prep,GERD  Medicated,  Endo/Other  negative endocrine ROS  Renal/GU negative Renal ROS  negative genitourinary   Musculoskeletal negative musculoskeletal ROS (+)   Abdominal   Peds negative pediatric ROS (+)  Hematology negative hematology ROS (+)   Anesthesia Other Findings Allergy    Bilateral shoulder pain 01/14/2015   Colitis    History of kidney stones    Hyperlipidemia LDL goal <130 05/18/2016 Start statin Nov 2017  Medication monitoring encounter 05/18/2016  Migraine    Palpitations 03/29/2015   Sinus congestion       Reproductive/Obstetrics negative OB ROS                             Anesthesia Physical Anesthesia Plan  ASA: 2  Anesthesia Plan: General   Post-op Pain Management:    Induction: Intravenous  PONV Risk Score and Plan: 2 and Propofol infusion and TIVA  Airway Management Planned: Natural Airway and Nasal Cannula  Additional Equipment:   Intra-op Plan:   Post-operative Plan:   Informed Consent: I have reviewed the patients History and Physical, chart, labs and discussed the procedure including the risks, benefits and alternatives for the proposed anesthesia with the patient or authorized representative who has indicated his/her understanding and acceptance.       Plan Discussed with: CRNA, Anesthesiologist and Surgeon  Anesthesia Plan Comments:          Anesthesia Quick Evaluation

## 2021-04-19 NOTE — Anesthesia Postprocedure Evaluation (Signed)
Anesthesia Post Note  Patient: Don Patterson  Procedure(s) Performed: COLONOSCOPY WITH PROPOFOL ESOPHAGOGASTRODUODENOSCOPY (EGD)  Patient location during evaluation: Phase II Anesthesia Type: General Level of consciousness: awake and alert, awake and oriented Pain management: pain level controlled Vital Signs Assessment: post-procedure vital signs reviewed and stable Respiratory status: spontaneous breathing, nonlabored ventilation and respiratory function stable Cardiovascular status: blood pressure returned to baseline and stable Postop Assessment: no apparent nausea or vomiting Anesthetic complications: no   No notable events documented.   Last Vitals:  Vitals:   04/19/21 0921 04/19/21 0931  BP: 113/82 132/79  Pulse: 80 67  Resp: 20 (!) 23  Temp:    SpO2: 100% 100%    Last Pain:  Vitals:   04/19/21 0931  TempSrc:   PainSc: 0-No pain                 Phill Mutter

## 2021-04-19 NOTE — H&P (Signed)
Arlyss Repress, MD [address]  [address]  [place], [place] [address]  Main: [phone]  Fax: [phone] Pager: [phone]  Primary Care Physician:  Danelle Berry, PA-C Primary Gastroenterologist:  Dr. Arlyss Repress  Pre-Procedure History & Physical: HPI:  Don Patterson is a 50 y.o. male is here for an endoscopy and colonoscopy.   Past Medical History:  Diagnosis Date   Allergy    Bilateral shoulder pain 01/14/2015   Colitis    History of kidney stones    Hyperlipidemia LDL goal <130 05/18/2016   Start statin Nov 2017   Hypertension    Medication monitoring encounter 05/18/2016   Migraine    Palpitations 03/29/2015   Sinus congestion     Past Surgical History:  Procedure Laterality Date   CYST REMOVAL NECK Left 07/04/1997   face   WISDOM TOOTH EXTRACTION      Prior to Admission medications   Medication Sig Start Date End Date Taking? Authorizing Provider  atorvastatin (LIPITOR) 20 MG tablet TAKE 1 TABLET BY MOUTH EVERYDAY AT BEDTIME 05/20/20  Yes Tapia, Leisa, PA-C  Flaxseed, Linseed, (FLAX SEEDS PO) Take by mouth daily.   Yes [provider]  GuaiFENesin (MUCINEX PO) Take as needed by mouth.   Yes [provider]  levocetirizine (XYZAL) 5 MG tablet TAKE 1 TABLET (5 MG TOTAL) BY MOUTH DAILY AS NEEDED FOR ALLERGIES. 12/08/20  Yes Angelica Chessman, Leisa, PA-C  montelukast (SINGULAIR) 10 MG tablet TAKE 1 TABLET BY MOUTH EVERYDAY AT BEDTIME 11/30/20  Yes Sowles, Danna Hefty, MD  Omega-3 Fatty Acids (FISH OIL PO) Take by mouth daily.   Yes [provider]  pantoprazole (PROTONIX) 20 MG tablet TAKE 1 TABLET (20 MG TOTAL) BY MOUTH DAILY. ON AN EMPTY STOMACH 12/23/20  Yes Danelle Berry, PA-C  sodium chloride (OCEAN) 0.65 % SOLN nasal spray Place 1 spray into both nostrils as needed for congestion.   Yes [provider]  triamcinolone (NASACORT) 55 MCG/ACT AERO nasal inhaler Place 2 sprays daily into the nose.    Yes [provider]   Sod Picosulfate-Mag Ox-Cit Acd (CLENPIQ) 10-3.5-12 MG-GM -GM/160ML SOLN Take 1 kit by mouth as directed. At 5 PM evening before procedure, drink 1 bottle of Clenpiq, hydrate, drink (5) 8 oz of water. Then do the same thing 5 hours prior to your procedure. 03/17/21   Toney Reil, MD    Allergies as of 03/17/2021 - Review Complete 03/17/2021  Allergen Reaction Noted   Cat hair extract     Dust mite extract Other (See Comments)    Tree extract      Family History  Problem Relation Age of Onset   Cancer Mother        breast and ovarian   Heart disease Father    Heart disease Paternal Uncle     Social History   Socioeconomic History   Marital status: Single    Spouse name: Not on file   Number of children: Not on file   Years of education: 16   Highest education level: Bachelor's degree (e.g., BA, AB, BS)  Occupational History   Not on file  Tobacco Use   Smoking status: Never   Smokeless tobacco: Never  Vaping Use   Vaping Use: Never used  Substance and Sexual Activity   Alcohol use: Yes    Alcohol/week: 0.0 standard drinks    Comment: rarely   Drug use: No   Sexual activity: Not Currently  Other Topics Concern  Not on file  Social History Narrative   Not on file   Social Determinants of Health   Financial Resource Strain: Low Risk    Difficulty of Paying Living Expenses: Not hard at all  Food Insecurity: No Food Insecurity   Worried About Charity fundraiser in the Last Year: Never true   Arboriculturist in the Last Year: Never true  Transportation Needs: No Transportation Needs   Lack of Transportation (Medical): No   Lack of Transportation (Non-Medical): No  Physical Activity: Insufficiently Active   Days of Exercise per Week: 2 days   Minutes of Exercise per Session: 20 min  Stress: Stress Concern Present   Feeling of Stress : To some extent  Social Connections: Moderately Integrated   Frequency of Communication with Friends and Family: More than  three times a week   Frequency of Social Gatherings with Friends and Family: Twice a week   Attends Religious Services: 1 to 4 times per year   Active Member of [employer] or Organizations: Yes   Attends Archivist Meetings: 1 to 4 times per year   Marital Status: Never married  Human resources officer Violence: Not At Risk   Fear of Current or Ex-Partner: No   Emotionally Abused: No   Physically Abused: No   Sexually Abused: No    Review of Systems: See HPI, otherwise negative ROS  Physical Exam: BP (!) 146/87   Pulse 66   Temp (!) 97.5 F (36.4 C) (Temporal)   Resp 16   Ht $R'5\' 8"'aN$  (1.727 m)   Wt 85.7 kg   SpO2 100%   BMI 28.74 kg/m  General:   Alert,  pleasant and cooperative in NAD Head:  Normocephalic and atraumatic. Neck:  Supple; no masses or thyromegaly. Lungs:  Clear throughout to auscultation.    Heart:  Regular rate and rhythm. Abdomen:  Soft, nontender and nondistended. Normal bowel sounds, without guarding, and without rebound.   Neurologic:  Alert and  oriented x4;  grossly normal neurologically.  Impression/Plan: Don Patterson is here for an endoscopy and colonoscopy to be performed for dyspepsia and rectal bleeding  Risks, benefits, limitations, and alternatives regarding  endoscopy and colonoscopy have been reviewed with the patient.  Questions have been answered.  All parties agreeable.   Sherri Sear, MD  04/19/2021, 8:06 AM

## 2021-04-20 ENCOUNTER — Encounter: Payer: Self-pay | Admitting: Gastroenterology

## 2021-04-20 ENCOUNTER — Other Ambulatory Visit: Payer: Self-pay | Admitting: Gastroenterology

## 2021-04-20 DIAGNOSIS — K219 Gastro-esophageal reflux disease without esophagitis: Secondary | ICD-10-CM

## 2021-04-20 LAB — SURGICAL PATHOLOGY

## 2021-04-20 MED ORDER — PANTOPRAZOLE SODIUM 40 MG PO TBEC: 40.0000 mg | DELAYED_RELEASE_TABLET | Freq: Two times a day (BID) | ORAL | 2 refills | Status: DC

## 2021-04-21 ENCOUNTER — Other Ambulatory Visit: Payer: Self-pay

## 2021-04-21 DIAGNOSIS — K219 Gastro-esophageal reflux disease without esophagitis: Secondary | ICD-10-CM

## 2021-04-21 MED ORDER — PANTOPRAZOLE SODIUM 40 MG PO TBEC: 40.0000 mg | DELAYED_RELEASE_TABLET | Freq: Two times a day (BID) | ORAL | 2 refills | Status: DC

## 2021-04-21 NOTE — Progress Notes (Signed)
Refill sent to pharmacy.   

## 2021-05-17 ENCOUNTER — Encounter: Payer: Self-pay | Admitting: Family Medicine

## 2021-05-21 ENCOUNTER — Encounter: Payer: Self-pay | Admitting: Family Medicine

## 2021-05-25 ENCOUNTER — Other Ambulatory Visit: Payer: Self-pay

## 2021-05-25 ENCOUNTER — Ambulatory Visit (INDEPENDENT_AMBULATORY_CARE_PROVIDER_SITE_OTHER): Payer: BC Managed Care – PPO | Admitting: Family Medicine

## 2021-05-25 ENCOUNTER — Encounter: Payer: Self-pay | Admitting: Family Medicine

## 2021-05-25 VITALS — BP 118/72 | HR 62 | Temp 97.8°F | Resp 16 | Wt 185.6 lb

## 2021-05-25 DIAGNOSIS — Z23 Encounter for immunization: Secondary | ICD-10-CM

## 2021-05-25 DIAGNOSIS — Z0000 Encounter for general adult medical examination without abnormal findings: Secondary | ICD-10-CM | POA: Diagnosis not present

## 2021-05-25 DIAGNOSIS — E663 Overweight: Secondary | ICD-10-CM

## 2021-05-25 DIAGNOSIS — J309 Allergic rhinitis, unspecified: Secondary | ICD-10-CM | POA: Diagnosis not present

## 2021-05-25 DIAGNOSIS — E785 Hyperlipidemia, unspecified: Secondary | ICD-10-CM | POA: Diagnosis not present

## 2021-05-25 DIAGNOSIS — R1013 Epigastric pain: Secondary | ICD-10-CM

## 2021-05-25 DIAGNOSIS — K219 Gastro-esophageal reflux disease without esophagitis: Secondary | ICD-10-CM

## 2021-05-25 MED ORDER — ATORVASTATIN CALCIUM 20 MG PO TABS: ORAL_TABLET | ORAL | 3 refills | Status: DC

## 2021-05-25 NOTE — Assessment & Plan Note (Signed)
Doing well on current regimen, no changes made today. 

## 2021-05-25 NOTE — Progress Notes (Signed)
BP 118/72   Pulse 62   Temp 97.8 F (36.6 C)   Resp 16   Wt 185 lb 9.6 oz (84.2 kg)   SpO2 99%   BMI 28.22 kg/m    Subjective:    Patient ID: Don Patterson, male    DOB: 18-Oct-1970, 50 y.o.   MRN: [id number]  HPI: Don Patterson is a 50 y.o. male presenting on 05/25/2021 for comprehensive medical examination. Current medical complaints include:none  Allergies - on xyzal, montelukast, nasacort. Exacerbated right now with season change.  GERD, Dyspepsia - follows with GI, has f/u in January - EGD 04/2021 with negative biopsies. - Meds: protonix BID - has adjusted diet, cut back on dairy, broccoli, cauliflower - Symptoms:  occasional gas and bloating.  - denies melena, hematochezia, hematemesis, and coffee ground emesis.   He currently lives with: alone  Depression Screen done today and results listed below:  Depression screen [facility] 2/9 05/25/2021 01/08/2021 05/20/2020 11/18/2019 05/28/2019  Decreased Interest 0 0 0 0 -  Down, Depressed, Hopeless 0 0 0 0 0  PHQ - 2 Score 0 0 0 0 0  Altered sleeping 0 0 - 0 0  Tired, decreased energy 0 0 - 0 0  Change in appetite 0 0 - 0 0  Feeling bad or failure about yourself  0 0 - 0 0  Trouble concentrating 0 0 - 0 0  Moving slowly or fidgety/restless 0 0 - 0 0  Suicidal thoughts 0 0 - 0 0  PHQ-9 Score 0 0 - 0 0  Difficult doing work/chores Not difficult at all Not difficult at all - Not difficult at all Not difficult at all    The patient does not have a history of falls. I did not complete a risk assessment for falls. A plan of care for falls was not documented.   Past Medical History:  Past Medical History:  Diagnosis Date   Allergy    Bilateral shoulder pain 01/14/2015   Colitis    History of kidney stones    Hyperlipidemia LDL goal <130 05/18/2016   Start statin Nov 2017   Hypertension    Medication monitoring encounter 05/18/2016   Migraine    Palpitations 03/29/2015   Sinus congestion     Surgical History:   Past Surgical History:  Procedure Laterality Date   COLONOSCOPY WITH PROPOFOL N/A 04/19/2021   Procedure: COLONOSCOPY WITH PROPOFOL;  Surgeon: Lin Landsman, MD;  Location: [facility];  Service: Gastroenterology;  Laterality: N/A;   CYST REMOVAL NECK Left 07/04/1997   face   ESOPHAGOGASTRODUODENOSCOPY N/A 04/19/2021   Procedure: ESOPHAGOGASTRODUODENOSCOPY (EGD);  Surgeon: Lin Landsman, MD;  Location: [facility] ENDOSCOPY;  Service: Gastroenterology;  Laterality: N/A;   WISDOM TOOTH EXTRACTION      Medications:  Current Outpatient Medications on File Prior to Visit  Medication Sig   Flaxseed, Linseed, (FLAX SEEDS PO) Take by mouth daily.   GuaiFENesin (MUCINEX PO) Take as needed by mouth.   levocetirizine (XYZAL) 5 MG tablet TAKE 1 TABLET (5 MG TOTAL) BY MOUTH DAILY AS NEEDED FOR ALLERGIES.   montelukast (SINGULAIR) 10 MG tablet TAKE 1 TABLET BY MOUTH EVERYDAY AT BEDTIME   Omega-3 Fatty Acids (FISH OIL PO) Take by mouth daily.   sodium chloride (OCEAN) 0.65 % SOLN nasal spray Place 1 spray into both nostrils as needed for congestion.   triamcinolone (NASACORT) 55 MCG/ACT AERO nasal inhaler Place 2 sprays daily into the nose.    pantoprazole (PROTONIX) 40  MG tablet Take 1 tablet (40 mg total) by mouth 2 (two) times daily before a meal. On an empty stomach   No current facility-administered medications on file prior to visit.    Allergies:  Allergies  Allergen Reactions   Cat Hair Extract    Dust Mite Extract Other (See Comments)   Tree Extract     Social History:  Social History   Socioeconomic History   Marital status: Single    Spouse name: Not on file   Number of children: Not on file   Years of education: 16   Highest education level: Bachelor's degree (e.g., BA, AB, BS)  Occupational History   Not on file  Tobacco Use   Smoking status: Never   Smokeless tobacco: Never  Vaping Use   Vaping Use: Never used  Substance and Sexual Activity   Alcohol use:  Yes    Alcohol/week: 0.0 standard drinks    Comment: rarely   Drug use: No   Sexual activity: Not Currently  Other Topics Concern   Not on file  Social History Narrative   Not on file   Social Determinants of Health   Financial Resource Strain: Low Risk    Difficulty of Paying Living Expenses: Not hard at all  Food Insecurity: No Food Insecurity   Worried About Charity fundraiser in the Last Year: Never true   [place] in the Last Year: Never true  Transportation Needs: No Transportation Needs   Lack of Transportation (Medical): No   Lack of Transportation (Non-Medical): No  Physical Activity: Insufficiently Active   Days of Exercise per Week: 2 days   Minutes of Exercise per Session: 20 min  Stress: Stress Concern Present   Feeling of Stress : To some extent  Social Connections: Moderately Isolated   Frequency of Communication with Friends and Family: Twice a week   Frequency of Social Gatherings with Friends and Family: Twice a week   Attends Religious Services: More than 4 times per year   Active Member of [employer] or Organizations: No   Attends Music therapist: Never   Marital Status: Never married  Human resources officer Violence: Not At Risk   Fear of Current or Ex-Partner: No   Emotionally Abused: No   Physically Abused: No   Sexually Abused: No   Social History   Tobacco Use  Smoking Status Never  Smokeless Tobacco Never   Social History   Substance and Sexual Activity  Alcohol Use Yes   Alcohol/week: 0.0 standard drinks   Comment: rarely    Family History:  Family History  Problem Relation Age of Onset   Cancer Mother        breast and ovarian   Heart disease Father    Heart disease Paternal Uncle     Past medical history, surgical history, medications, allergies, family history and social history reviewed with patient today and changes made to appropriate areas of the chart.      Objective:    BP 118/72   Pulse 62   Temp 97.8  F (36.6 C)   Resp 16   Wt 185 lb 9.6 oz (84.2 kg)   SpO2 99%   BMI 28.22 kg/m   Wt Readings from Last 3 Encounters:  05/25/21 185 lb 9.6 oz (84.2 kg)  04/19/21 189 lb (85.7 kg)  03/17/21 187 lb 12.8 oz (85.2 kg)    Physical Exam Constitutional:      Appearance: Normal appearance. He is  not ill-appearing.  HENT:     Head: Normocephalic and atraumatic.     Right Ear: Tympanic membrane, ear canal and external ear normal.     Left Ear: Tympanic membrane, ear canal and external ear normal.     Nose: Nose normal.     Mouth/Throat:     Mouth: Mucous membranes are moist.     Pharynx: Oropharynx is clear.  Eyes:     Extraocular Movements: Extraocular movements intact.     Pupils: Pupils are equal, round, and reactive to light.  Cardiovascular:     Rate and Rhythm: Normal rate and regular rhythm.     Heart sounds: Normal heart sounds. No murmur heard. Pulmonary:     Effort: Pulmonary effort is normal.     Breath sounds: Normal breath sounds.  Abdominal:     General: Bowel sounds are normal.     Palpations: Abdomen is soft.     Tenderness: There is no abdominal tenderness.  Musculoskeletal:        General: Normal range of motion.     Cervical back: Normal range of motion.     Right lower leg: No edema.     Left lower leg: No edema.  Lymphadenopathy:     Cervical: No cervical adenopathy.  Skin:    General: Skin is warm and dry.  Neurological:     Mental Status: He is alert and oriented to person, place, and time. Mental status is at baseline.     Gait: Gait normal.  Psychiatric:        Mood and Affect: Mood normal.        Behavior: Behavior normal.    Results for orders placed or performed during the hospital encounter of 04/19/21  Surgical pathology  Result Value Ref Range   SURGICAL PATHOLOGY      SURGICAL PATHOLOGY CASE: [id number] PATIENT: Dwana Melena Surgical Pathology Report     Specimen Submitted: A. Duodenum; cbx B. Stomach; cbx C. Esophagus;  cbx D. Colon polyp, cecum; cold snare  Clinical History: Rectal bleeding K62.5 for colonoscopy and dyspepsia R10.13.  Stomach polyps, inlet patch, external hemorrhoids, colon polyp, diverticulosis      DIAGNOSIS: A. DUODENUM; COLD BIOPSY: - DUODENAL MUCOSA WITH NO SIGNIFICANT PATHOLOGIC ALTERATION. - NEGATIVE FOR FEATURES OF CELIAC DISEASE. - NEGATIVE FOR DYSPLASIA AND MALIGNANCY.  B. STOMACH; COLD BIOPSY: - OXYNTIC MUCOSA WITH NO SIGNIFICANT PATHOLOGIC ALTERATION. - NEGATIVE FOR ACTIVE INFLAMMATION AND H PYLORI. - NEGATIVE FOR INTESTINAL METAPLASIA, DYSPLASIA, AND MALIGNANCY.  C. ESOPHAGUS; COLD BIOPSY: - SQUAMOCOLUMNAR JUNCTION WITH NO SIGNIFICANT PATHOLOGIC ALTERATION. - NEGATIVE FOR INCREASED EOSINOPHILS. - NEGATIVE FOR INTESTINAL METAPLASIA, DYSPLASIA, AND MALIGNANCY.  D. COLON POLYP,  CECUM; COLD SNARE: - HYPERPLASTIC POLYP. - NEGATIVE FOR DYSPLASIA AND MALIGNANCY.   GROSS DESCRIPTION: A. Labeled: cbx duodenum Received: Formalin Collection time: 8:44 AM on 04/19/2021 Placed into formalin time: 8:44 AM on 04/19/2021 Tissue fragment(s): Multiple Size: Aggregate, 1.3 x 0.3 x 0.1 cm Description: Pink soft tissue fragments Entirely submitted in 1 cassette.  B. Labeled: cbx stomach rule out H. pylori Received: Formalin Collection time: 8:46 AM on 04/19/2021 Placed into formalin time: 8:46 AM on 04/19/2021 Tissue fragment(s): Multiple Size: Aggregate, 0.6 x 0.4 x 0.1 cm Description: Tan soft tissue fragments Entirely submitted in 1 cassette.  C. Labeled: cbx esophagus rule out EOE Received: Formalin Collection time: 8:49 AM on 04/19/2021 Placed into formalin time: 8:49 AM on 04/19/2021 Tissue fragment(s): Multiple Size: Aggregate, 1.3 x 0.3 x 0.1 cm Description: Pink,  translucent soft tissue fragments Entirely submitted in 1 cassette.  D. L abeled: Cold snare polyp cecum colon Received: Formalin Collection time: 9:01 AM on 04/19/2021 Placed into formalin  time: 9:01 AM on 04/19/2021 Tissue fragment(s): 3 Size: Aggregate, 0.7 x 0.3 x 0.1 cm Description: Pink soft tissue fragments Entirely submitted in 1 cassette.  [facility] 04/19/2021  Final Diagnosis performed by Betsy Pries, MD.   Electronically signed 04/20/2021 10:44:31AM The electronic signature indicates that the named Attending Pathologist has evaluated the specimen Technical component performed at [facility], [address], [place], [place] [address] Lab: [phone] Dir: Rush Farmer, MD, MMM  Professional component performed at [facility], [facility], [place], [place], [place] [address] Lab: [phone] Dir: Kathi Simpers, MD       Assessment & Plan:   Problem List Items Addressed This Visit       Respiratory   Allergic rhinitis    Doing well on current regimen, no changes made today.        Digestive   Gastroesophageal reflux disease without esophagitis     Other   Hyperlipidemia   Relevant Medications   atorvastatin (LIPITOR) 20 MG tablet   Other Relevant Orders   Lipid panel   Overweight (BMI 25.0-29.9)   Dyspepsia   Other Visit Diagnoses     Need for influenza vaccination    -  Primary   Relevant Orders   Flu Vaccine QUAD 6+ mos PF IM (Fluarix Quad PF) (Completed)        LABORATORY TESTING:  Health maintenance labs ordered today as discussed above.   The natural history of prostate cancer and ongoing controversy regarding screening and potential treatment outcomes of prostate cancer has been discussed with the patient. The meaning of a false positive PSA and a false negative PSA has been discussed. No FH of prostate cancer. Screening not indicated at this time.    IMMUNIZATIONS:   - Tdap: Tetanus vaccination status reviewed: last tetanus booster within 10 years. - Influenza: Administered today - Pneumococcal: Not applicable - HPV: Not applicable - Shingrix vaccine: Not applicable - COVID vaccine: has received 3 doses of  mRNA vaccine  SCREENING: - Colonoscopy: Up to date  Discussed with patient purpose of the colonoscopy is to detect colon cancer at curable precancerous or early stages   - AAA Screening: Not applicable  - Lung cancer screening: n/a  Hep C Screening: UTD STD testing and prevention (HIV/chl/gon/syphilis): n/a Sexual History: not currently sexually active Incontinence Symptoms: none  PATIENT COUNSELING:    Sexuality: Discussed sexually transmitted diseases, partner selection, use of condoms, avoidance of unintended pregnancy  and contraceptive alternatives.   Advised to avoid cigarette smoking.  I discussed with the patient that most people either abstain from alcohol or drink within safe limits (<=14/week and <=4 drinks/occasion for males, <=7/weeks and <= 3 drinks/occasion for females) and that the risk for alcohol disorders and other health effects rises proportionally with the number of drinks per week and how often a drinker exceeds daily limits.  Discussed cessation/primary prevention of drug use and availability of treatment for abuse.   Diet: Encouraged to adjust caloric intake to maintain  or achieve ideal body weight, to reduce intake of dietary saturated fat and total fat, to limit sodium intake by avoiding high sodium foods and not adding table salt, and to maintain adequate dietary potassium and calcium preferably from fresh fruits, vegetables, and low-fat dairy products.    Stressed the importance of regular exercise  Injury prevention: Discussed safety belts, safety helmets, smoke detector, smoking near bedding or upholstery.   Dental health: Discussed importance of regular tooth brushing, flossing, and dental visits.   Follow up plan: NEXT PREVENTATIVE PHYSICAL DUE IN 1 YEAR. Return in about 1 year (around 05/25/2022) for cpe.

## 2021-05-25 NOTE — Patient Instructions (Signed)

## 2021-05-26 LAB — LIPID PANEL
Cholesterol: 178 mg/dL (ref <200)
HDL: 55 mg/dL (ref >=40)
LDL Cholesterol (Calc): 103 mg/dL (calc) — ABNORMAL HIGH
Non-HDL Cholesterol (Calc): 123 mg/dL (calc) (ref <130)
Total CHOL/HDL Ratio: 3.2 (calc) (ref <5.0)
Triglycerides: 104 mg/dL (ref <150)

## 2021-07-23 ENCOUNTER — Encounter: Payer: Self-pay | Admitting: Family Medicine

## 2021-11-22 ENCOUNTER — Other Ambulatory Visit: Payer: Self-pay | Admitting: Family Medicine

## 2021-11-22 DIAGNOSIS — J309 Allergic rhinitis, unspecified: Secondary | ICD-10-CM

## 2021-11-25 ENCOUNTER — Encounter: Payer: Self-pay | Admitting: Family Medicine

## 2021-11-25 ENCOUNTER — Other Ambulatory Visit: Payer: Self-pay

## 2021-11-25 DIAGNOSIS — J309 Allergic rhinitis, unspecified: Secondary | ICD-10-CM

## 2021-11-25 MED ORDER — MONTELUKAST SODIUM 10 MG PO TABS: ORAL_TABLET | ORAL | 3 refills | Status: DC

## 2021-12-27 ENCOUNTER — Other Ambulatory Visit: Payer: Self-pay | Admitting: Gastroenterology

## 2021-12-27 DIAGNOSIS — K219 Gastro-esophageal reflux disease without esophagitis: Secondary | ICD-10-CM

## 2021-12-31 ENCOUNTER — Other Ambulatory Visit: Payer: Self-pay | Admitting: Gastroenterology

## 2021-12-31 DIAGNOSIS — K219 Gastro-esophageal reflux disease without esophagitis: Secondary | ICD-10-CM

## 2022-01-10 ENCOUNTER — Other Ambulatory Visit: Payer: Self-pay | Admitting: Gastroenterology

## 2022-01-10 DIAGNOSIS — K219 Gastro-esophageal reflux disease without esophagitis: Secondary | ICD-10-CM

## 2022-01-11 MED ORDER — PANTOPRAZOLE SODIUM 40 MG PO TBEC: 40.0000 mg | DELAYED_RELEASE_TABLET | Freq: Two times a day (BID) | ORAL | 2 refills | Status: DC

## 2022-04-07 ENCOUNTER — Other Ambulatory Visit: Payer: Self-pay | Admitting: Gastroenterology

## 2022-04-07 DIAGNOSIS — K219 Gastro-esophageal reflux disease without esophagitis: Secondary | ICD-10-CM

## 2022-04-09 ENCOUNTER — Other Ambulatory Visit: Payer: Self-pay | Admitting: Gastroenterology

## 2022-04-09 DIAGNOSIS — K219 Gastro-esophageal reflux disease without esophagitis: Secondary | ICD-10-CM

## 2022-05-19 ENCOUNTER — Other Ambulatory Visit: Payer: Self-pay | Admitting: Family Medicine

## 2022-05-19 DIAGNOSIS — E785 Hyperlipidemia, unspecified: Secondary | ICD-10-CM

## 2022-05-19 NOTE — Telephone Encounter (Signed)
Requested Prescriptions  Pending Prescriptions Disp Refills   atorvastatin (LIPITOR) 20 MG tablet [Pharmacy Med Name: ATORVASTATIN 20 MG TABLET] 90 tablet 3    Sig: TAKE 1 TABLET BY MOUTH EVERYDAY AT BEDTIME     Cardiovascular:  Antilipid - Statins Failed - 05/19/2022  9:14 AM      Failed - Lipid Panel in normal range within the last 12 months    Cholesterol, Total  Date Value Ref Range Status  05/19/2015 285 (H) 100 - 199 mg/dL Final   Cholesterol  Date Value Ref Range Status  05/25/2021 178 <200 mg/dL Final   LDL Cholesterol (Calc)  Date Value Ref Range Status  05/25/2021 103 (H) mg/dL (calc) Final    Comment:    Reference range: <100 . Desirable range <100 mg/dL for primary prevention;   <70 mg/dL for patients with CHD or diabetic patients  with > or = 2 CHD risk factors. Marland Kitchen LDL-C is now calculated using the Martin-Hopkins  calculation, which is a validated novel method providing  better accuracy than the Friedewald equation in the  estimation of LDL-C.  Cresenciano Genre et al. Annamaria Helling. 8250;539(76): 2061-2068  (http://education.[facility].com/faq/FAQ164)    HDL  Date Value Ref Range Status  05/25/2021 55 > OR = 40 mg/dL Final  05/19/2015 31 (L) >39 mg/dL Final    Comment:    According to ATP-III Guidelines, HDL-C >59 mg/dL is considered a negative risk factor for CHD.    Triglycerides  Date Value Ref Range Status  05/25/2021 104 <150 mg/dL Final         Passed - Patient is not pregnant      Passed - Valid encounter within last 12 months    Recent Outpatient Visits           11 months ago Routine general medical examination at a health care facility   [place], DO   1 year ago Abdominal pain, unspecified abdominal location   [facility] Delsa Grana, PA-C   1 year ago Adult general medical exam   [facility] Delsa Grana, PA-C   2 years ago Hyperlipidemia, unspecified  hyperlipidemia type   [facility] Delsa Grana, PA-C   2 years ago [facility] Delsa Grana, PA-C       Future Appointments             In 1 week Delsa Grana, PA-C [facility], [place]   In 2 months Vanga, Tally Due, MD [place]

## 2022-05-25 ENCOUNTER — Encounter: Payer: Self-pay | Admitting: Family Medicine

## 2022-05-25 NOTE — Patient Instructions (Signed)

## 2022-05-30 ENCOUNTER — Ambulatory Visit (INDEPENDENT_AMBULATORY_CARE_PROVIDER_SITE_OTHER): Payer: BC Managed Care – PPO | Admitting: Family Medicine

## 2022-05-30 ENCOUNTER — Encounter: Payer: Self-pay | Admitting: Family Medicine

## 2022-05-30 VITALS — BP 130/72 | HR 63 | Temp 97.7°F | Resp 16 | Ht 67.5 in | Wt 199.1 lb

## 2022-05-30 DIAGNOSIS — Z23 Encounter for immunization: Secondary | ICD-10-CM

## 2022-05-30 DIAGNOSIS — Z0000 Encounter for general adult medical examination without abnormal findings: Secondary | ICD-10-CM | POA: Diagnosis not present

## 2022-05-30 DIAGNOSIS — E785 Hyperlipidemia, unspecified: Secondary | ICD-10-CM | POA: Diagnosis not present

## 2022-05-30 MED ORDER — ATORVASTATIN CALCIUM 20 MG PO TABS: ORAL_TABLET | ORAL | 3 refills | Status: DC

## 2022-05-30 NOTE — Progress Notes (Signed)
Patient: Don Patterson, Male    DOB: 04-Feb-1971, 51 y.o.   MRN: [id number] Delsa Grana, PA-C Visit Date: 05/30/2022  Today's Provider: Delsa Grana, PA-C   Chief Complaint  Patient presents with   Annual Exam   Subjective:   Annual physical exam:  Don Patterson is a 51 y.o. male who presents today for health maintenance and annual & complete physical exam.   Exercise/Activity:  more active, working out treadmill 4 d a week at least 30 min or 2+ miles Diet/nutrition:  working on healthy choicing Sleep:  no concerns overall, just some anxiety with this busy time of year affects sleep a little   [place]: No Food Insecurity (05/30/2022)  Housing: [place]  (05/30/2022)  Transportation Needs: No Transportation Needs (05/30/2022)  Utilities: Not At Risk (05/30/2022)  Alcohol Screen: Low Risk  (05/30/2022)  Depression (PHQ2-9): Low Risk  (05/30/2022)  Financial Resource Strain: Low Risk  (05/30/2022)  Physical Activity: Insufficiently Active (05/30/2022)  Social Connections: Moderately Integrated (05/30/2022)  Stress: Stress Concern Present (05/30/2022)  Tobacco Use: Low Risk  (05/30/2022)     USPSTF grade A and B recommendations - reviewed and addressed today  Depression:  Phq 9 completed today by patient, was reviewed by me with patient in the room, score is  negative, pt feels mood is good but he has been more anxious lately    05/30/2022    8:09 AM 05/25/2021    8:18 AM 01/08/2021    9:47 AM  Depression screen PHQ 2/9  Decreased Interest 0 0 0  Down, Depressed, Hopeless 1 0 0  PHQ - 2 Score 1 0 0  Altered sleeping 1 0 0  Tired, decreased energy 1 0 0  Change in appetite 0 0 0  Feeling bad or failure about yourself  0 0 0  Trouble concentrating 0 0 0  Moving slowly or fidgety/restless 0 0 0  Suicidal thoughts 0 0 0  PHQ-9 Score 3 0 0  Difficult doing work/chores Somewhat difficult Not difficult at all Not difficult at all     Hep C Screening: done previously  STD testing and prevention (HIV/chl/gon/syphilis):    Intimate partner violence:  done   Health Maintenance  Topic Date Due   COVID-19 Vaccine (4 - 2023-24 season) 06/15/2022 (Originally 03/04/2022)   COLONOSCOPY (Pts 45-77yr Insurance coverage will need to be confirmed)  04/19/2028   INFLUENZA VACCINE  Completed   Hepatitis C Screening  Completed   HIV Screening  Completed   HPV VACCINES  Aged Out    Skin cancer:  Pt reports no hx of skin cancer, suspicious lesions/biopsies in the past.  Colorectal cancer:  colonoscopy is UTD Pt denies GI sx, change in bowels  Prostate cancer:  Prostate cancer screening with PSA: Discussed risks and benefits of PSA testing and provided handout. Pt declines to have PSA drawn today. No results found for: "PSA"  Urinary Symptoms:  no sx, screening not done due to age  Lung cancer:   Low Dose CT Chest recommended if Age 49-80years, 20 pack-year currently smoking OR have quit w/in 15years. Patient does not qualify.   Social History   Tobacco Use   Smoking status: Never   Smokeless tobacco: Never  Substance Use Topics   Alcohol use: Yes    Alcohol/week: 0.0 standard drinks of alcohol    Comment: rarely     [facility]  AUDIT-C Score 1       AAA: n/a The USPSTF recommends one-time screening with ultrasonography in men ages 42 to 30 years who have ever smoked  ECG:n/a  Blood pressure/Hypertension: BP Readings from Last 3 Encounters:  05/30/22 130/72  05/25/21 118/72  04/19/21 132/79   Weight/Obesity: Wt Readings from Last 10 Encounters:  05/30/22 199 lb 1.6 oz (90.3 kg)  05/25/21 185 lb 9.6 oz (84.2 kg)  04/19/21 189 lb (85.7 kg)  03/17/21 187 lb 12.8 oz (85.2 kg)  01/08/21 185 lb 12.8 oz (84.3 kg)  05/20/20 197 lb (89.4 kg)  11/18/19 205 lb 1.6 oz (93 kg)  05/28/19 213 lb (96.6 kg)  05/20/19 212 lb  1.6 oz (96.2 kg)  05/17/18 200 lb 3.2 oz (90.8 kg)   BMI Readings from Last 3 Encounters:  05/30/22 30.72 kg/m  05/25/21 28.22 kg/m  04/19/21 28.74 kg/m    Lipids:  Lab Results  Component Value Date   CHOL 178 05/25/2021   CHOL 164 05/20/2020   CHOL 166 11/18/2019   Lab Results  Component Value Date   HDL 55 05/25/2021   HDL 38 (L) 05/20/2020   HDL 44 11/18/2019   Lab Results  Component Value Date   LDLCALC 103 (H) 05/25/2021   LDLCALC 99 05/20/2020   LDLCALC 97 11/18/2019   Lab Results  Component Value Date   TRIG 104 05/25/2021   TRIG 175 (H) 05/20/2020   TRIG 153 (H) 11/18/2019   Lab Results  Component Value Date   CHOLHDL 3.2 05/25/2021   CHOLHDL 4.3 05/20/2020   CHOLHDL 3.8 11/18/2019   No results found for: "LDLDIRECT" Based on the results of lipid panel his/her cardiovascular risk factor ( using [place] )  in the next 10 years is : The 10-year ASCVD risk score (Arnett DK, et al., 2019) is: 2.4%   Values used to calculate the score:     Age: 54 years     Sex: Male     Is Non-Hispanic African American: No     Diabetic: No     Tobacco smoker: No     Systolic Blood Pressure: 213 mmHg     Is BP treated: No     HDL Cholesterol: 55 mg/dL     Total Cholesterol: 178 mg/dL Glucose:  Glucose, Bld  Date Value Ref Range Status  01/08/2021 96 65 - 99 mg/dL Final    Comment:    .            Fasting reference interval .   05/20/2020 95 65 - 99 mg/dL Final    Comment:    .            Fasting reference interval .   11/18/2019 103 (H) 65 - 99 mg/dL Final    Comment:    .            Fasting reference interval . For someone without known diabetes, a glucose value between 100 and 125 mg/dL is consistent with prediabetes and should be confirmed with a follow-up test. .     Social History       Social History   Socioeconomic History   Marital status: Single    Spouse name: Not on file   Number of children: Not on file   Years of  education: 16   Highest education level: Bachelor's degree (e.g., BA, AB, BS)  Occupational History   Not on file  Tobacco Use   Smoking  status: Never   Smokeless tobacco: Never  Vaping Use   Vaping Use: Never used  Substance and Sexual Activity   Alcohol use: Yes    Alcohol/week: 0.0 standard drinks of alcohol    Comment: rarely   Drug use: No   Sexual activity: Not Currently  Other Topics Concern   Not on file  Social History Narrative   Not on file   Social Determinants of Health   Financial Resource Strain: Low Risk  (05/30/2022)   Overall Financial Resource Strain (CARDIA)    Difficulty of Paying Living Expenses: Not hard at all  Food Insecurity: No Food Insecurity (05/30/2022)   Hunger Vital Sign    Worried About Running Out of Food in the Last Year: Never true    Ran Out of Food in the Last Year: Never true  Transportation Needs: No Transportation Needs (05/30/2022)   PRAPARE - Hydrologist (Medical): No    Lack of Transportation (Non-Medical): No  Physical Activity: Insufficiently Active (05/30/2022)   Exercise Vital Sign    Days of Exercise per Week: 4 days    Minutes of Exercise per Session: 30 min  Stress: Stress Concern Present (05/30/2022)   [place]    Feeling of Stress : To some extent  Social Connections: Moderately Integrated (05/30/2022)   Social Connection and Isolation Panel [NHANES]    Frequency of Communication with Friends and Family: More than three times a week    Frequency of Social Gatherings with Friends and Family: Twice a week    Attends Religious Services: 1 to 4 times per year    Active Member of [employer] or Organizations: Yes    Attends Music therapist: More than 4 times per year    Marital Status: Never married    Family History        Family History  Problem Relation Age of Onset   Cancer Mother        breast and ovarian    Heart disease Father    Heart disease Paternal Uncle     Patient Active Problem List   Diagnosis Date Noted   Rectal bleeding    Polyp of colon    Dyspepsia    Abdominal bloating    Allergic rhinitis 11/20/2019   Overweight (BMI 25.0-29.9) 05/17/2018   Hyperlipidemia 05/18/2016   Migraine aura without headache 01/14/2015   Gastroesophageal reflux disease without esophagitis 01/14/2015    Past Surgical History:  Procedure Laterality Date   COLONOSCOPY WITH PROPOFOL N/A 04/19/2021   Procedure: COLONOSCOPY WITH PROPOFOL;  Surgeon: Lin Landsman, MD;  Location: [place];  Service: Gastroenterology;  Laterality: N/A;   CYST REMOVAL NECK Left 07/04/1997   face   ESOPHAGOGASTRODUODENOSCOPY N/A 04/19/2021   Procedure: ESOPHAGOGASTRODUODENOSCOPY (EGD);  Surgeon: Lin Landsman, MD;  Location: [facility] ENDOSCOPY;  Service: Gastroenterology;  Laterality: N/A;   WISDOM TOOTH EXTRACTION       Current Outpatient Medications:    atorvastatin (LIPITOR) 20 MG tablet, TAKE 1 TABLET BY MOUTH EVERYDAY AT BEDTIME, Disp: 90 tablet, Rfl: 0   Flaxseed, Linseed, (FLAX SEEDS PO), Take by mouth daily., Disp: , Rfl:    GuaiFENesin (MUCINEX PO), Take as needed by mouth., Disp: , Rfl:    levocetirizine (XYZAL) 5 MG tablet, TAKE 1 TABLET BY MOUTH DAILY AS NEEDED FOR ALLERGIES., Disp: 90 tablet, Rfl: 3   montelukast (SINGULAIR) 10 MG tablet, TAKE 1 TABLET BY MOUTH EVERYDAY  AT BEDTIME, Disp: 90 tablet, Rfl: 3   Omega-3 Fatty Acids (FISH OIL PO), Take by mouth daily., Disp: , Rfl:    pantoprazole (PROTONIX) 40 MG tablet, TAKE 1 TABLET (40 MG TOTAL) BY MOUTH 2 (TWO) TIMES DAILY BEFORE A MEAL. ON AN EMPTY STOMACH, Disp: 180 tablet, Rfl: 0   sodium chloride (OCEAN) 0.65 % SOLN nasal spray, Place 1 spray into both nostrils as needed for congestion., Disp: , Rfl:    triamcinolone (NASACORT) 55 MCG/ACT AERO nasal inhaler, Place 2 sprays daily into the nose. , Disp: , Rfl:   Allergies  Allergen  Reactions   Cat Hair Extract    Dust Mite Extract Other (See Comments)   Tree Extract     Patient Care Team: Delsa Grana, PA-C as PCP - General (Family Medicine)   Chart Review: I personally reviewed active problem list, medication list, allergies, family history, social history, health maintenance, notes from last encounter, lab results, imaging with the patient/caregiver today.   Review of Systems  Constitutional: Negative.   HENT: Negative.    Eyes: Negative.   Respiratory: Negative.    Cardiovascular: Negative.   Gastrointestinal: Negative.   Endocrine: Negative.   Genitourinary: Negative.   Musculoskeletal: Negative.   Skin: Negative.   Allergic/Immunologic: Negative.   Neurological: Negative.   Hematological: Negative.   Psychiatric/Behavioral: Negative.    All other systems reviewed and are negative.         Objective:   Vitals:  Vitals:   05/30/22 0827  BP: 130/72  Pulse: 63  Resp: 16  Temp: 97.7 F (36.5 C)  TempSrc: Oral  SpO2: 99%  Weight: 199 lb 1.6 oz (90.3 kg)  Height: 5' 7.5" (1.715 m)    Body mass index is 30.72 kg/m.  Physical Exam Vitals and nursing note reviewed.  Constitutional:      General: He is not in acute distress.    Appearance: Normal appearance. He is well-developed. He is obese. He is not ill-appearing, toxic-appearing or diaphoretic.  HENT:     Head: Normocephalic and atraumatic.     Jaw: No trismus.     Right Ear: Tympanic membrane, ear canal and external ear normal.     Left Ear: Tympanic membrane, ear canal and external ear normal.     Nose: Congestion present. No mucosal edema or rhinorrhea.     Right Sinus: No maxillary sinus tenderness or frontal sinus tenderness.     Left Sinus: No maxillary sinus tenderness or frontal sinus tenderness.     Mouth/Throat:     Mouth: Mucous membranes are moist.     Pharynx: Uvula midline. Posterior oropharyngeal erythema present. No oropharyngeal exudate or uvula swelling.  Eyes:      General: Lids are normal. No scleral icterus.       Right eye: No discharge.        Left eye: No discharge.     Conjunctiva/sclera: Conjunctivae normal.  Neck:     Trachea: Trachea and phonation normal. No tracheal deviation.  Cardiovascular:     Rate and Rhythm: Normal rate and regular rhythm.     Pulses: Normal pulses.          Radial pulses are 2+ on the right side and 2+ on the left side.       Posterior tibial pulses are 2+ on the right side and 2+ on the left side.     Heart sounds: Normal heart sounds. No murmur heard.    No friction rub. No  gallop.  Pulmonary:     Effort: Pulmonary effort is normal. No respiratory distress.     Breath sounds: Normal breath sounds. No wheezing, rhonchi or rales.  Abdominal:     General: Bowel sounds are normal. There is no distension.     Palpations: Abdomen is soft.     Tenderness: There is no abdominal tenderness. There is no guarding or rebound.  Musculoskeletal:     Cervical back: Normal range of motion and neck supple.     Right lower leg: No edema.     Left lower leg: No edema.  Lymphadenopathy:     Cervical: No cervical adenopathy.  Skin:    General: Skin is warm and dry.     Capillary Refill: Capillary refill takes less than 2 seconds.     Findings: No rash.  Neurological:     Mental Status: He is alert. Mental status is at baseline.     Gait: Gait normal.  Psychiatric:        Mood and Affect: Mood normal.        Speech: Speech normal.        Behavior: Behavior normal.      No results found for this or any previous visit (from the past 2160 hour(s)).  Fall Risk:    05/30/2022    8:09 AM 05/25/2021    8:18 AM 01/08/2021    9:47 AM 05/20/2020    8:09 AM 11/18/2019    8:02 AM  Fall Risk   Falls in the past year? 0 0 0 0 0  Number falls in past yr: 0 0 0 0 0  Injury with Fall? 0 0 0 0 0  Risk for fall due to : No Fall Risks      Follow up Falls prevention discussed;Education provided;Falls evaluation completed    Falls evaluation completed Falls evaluation completed    Functional Status Survey: Is the patient deaf or have difficulty hearing?: No Does the patient have difficulty seeing, even when wearing glasses/contacts?: No Does the patient have difficulty concentrating, remembering, or making decisions?: No Does the patient have difficulty walking or climbing stairs?: No Does the patient have difficulty dressing or bathing?: No Does the patient have difficulty doing errands alone such as visiting a doctor's office or shopping?: No   Assessment & Plan:    CPE completed today  Prostate cancer screening and PSA options (with potential risks and benefits of testing vs not testing) were discussed along with recent recs/guidelines, shared decision making and handout/information given to pt today  USPSTF grade A and B recommendations reviewed with patient; age-appropriate recommendations, preventive care, screening tests, etc discussed and encouraged; healthy living encouraged; see AVS for patient education given to patient  Discussed importance of 150 minutes of physical activity weekly, AHA exercise recommendations given to pt in AVS/handout  Discussed importance of healthy diet:  eating lean meats and proteins, avoiding trans fats and saturated fats, avoid simple sugars and excessive carbs in diet, eat 6 servings of fruit/vegetables daily and drink plenty of water and avoid sweet beverages.  DASH diet reviewed if pt has HTN  Recommended pt to do annual eye exam and routine dental exams/cleanings  Advance Care planning information and packet discussed and offered today, encouraged pt to discuss with family members/spouse/partner/friends and complete Advanced directive packet and bring copy to office   Reviewed Health Maintenance: Health Maintenance  Topic Date Due   COVID-19 Vaccine (4 - 2023-24 season) 06/15/2022 (Originally 03/04/2022)   COLONOSCOPY (  Pts 45-48yr Insurance coverage will need to  be confirmed)  04/19/2028   INFLUENZA VACCINE  Completed   Hepatitis C Screening  Completed   HIV Screening  Completed   HPV VACCINES  Aged Out    Immunizations: Immunization History  Administered Date(s) Administered   Hepatitis B 08/06/2007   IPV 08/06/2007   Influenza,inj,Quad PF,6+ Mos 05/17/2018, 05/20/2019, 05/20/2020, 05/25/2021, 05/30/2022   Influenza-Unspecified 05/10/2016, 05/09/2017, 04/07/2018   MMR 09/25/2009   Moderna Sars-Covid-2 Vaccination 08/31/2019, 09/28/2019, 06/30/2020   Tdap 08/06/2007, 05/17/2018   Vaccines:  HPV: up to at age 49, ask insurance if age between 256-45 Shingrix: 510-64yo and ask insurance if covered when patient above 641yo Pneumonia:  educated and discussed with patient. Flu: done educated and discussed with patient.      ICD-10-CM   1. Annual physical exam  Z00.00 CBC with Differential/Platelet    COMPLETE METABOLIC PANEL WITH GFR    Lipid panel    2. Need for influenza vaccination  Z23 Flu Vaccine QUAD 633moM (Fluarix, Fluzone & Alfiuria Quad PF)    3. Hyperlipidemia, unspecified hyperlipidemia type  E78.5 atorvastatin (LIPITOR) 20 MG tablet   med refills sent in      He has f/up with GI about PPI- s he was encouraged to ask about long-term use of PPI if he should try to use once a day or if he is able to use twice daily long-term Can do 6 month routine follow-up    LeDelsa GranaPAHershal Coria1/27/23 8:48 AM  [facility]

## 2022-05-31 LAB — CBC WITH DIFFERENTIAL/PLATELET
Absolute Monocytes: 525 cells/uL (ref 200–950)
Basophils Absolute: 83 cells/uL (ref 0–200)
Basophils Relative: 1.6 %
Eosinophils Absolute: 83 cells/uL (ref 15–500)
Eosinophils Relative: 1.6 %
HCT: 47 % (ref 38.5–50.0)
Hemoglobin: 16.1 g/dL (ref 13.2–17.1)
Lymphs Abs: 1914 cells/uL (ref 850–3900)
MCH: 31.1 pg (ref 27.0–33.0)
MCHC: 34.3 g/dL (ref 32.0–36.0)
MCV: 90.7 fL (ref 80.0–100.0)
MPV: 11.2 fL (ref 7.5–12.5)
Monocytes Relative: 10.1 %
Neutro Abs: 2595 cells/uL (ref 1500–7800)
Neutrophils Relative %: 49.9 %
Platelets: 224 10*3/uL (ref 140–400)
RBC: 5.18 10*6/uL (ref 4.20–5.80)
RDW: 12.9 % (ref 11.0–15.0)
Total Lymphocyte: 36.8 %
WBC: 5.2 10*3/uL (ref 3.8–10.8)

## 2022-05-31 LAB — COMPLETE METABOLIC PANEL WITH GFR
AG Ratio: 2.1 (calc) (ref 1.0–2.5)
ALT: 49 U/L — ABNORMAL HIGH (ref 9–46)
AST: 20 U/L (ref 10–40)
Albumin: 4.9 g/dL (ref 3.6–5.1)
Alkaline phosphatase (APISO): 62 U/L (ref 36–130)
BUN: 11 mg/dL (ref 7–25)
CO2: 28 mmol/L (ref 20–32)
Calcium: 9.5 mg/dL (ref 8.6–10.3)
Chloride: 105 mmol/L (ref 98–110)
Creat: 1.06 mg/dL (ref 0.60–1.29)
Globulin: 2.3 g/dL (calc) (ref 1.9–3.7)
Glucose, Bld: 95 mg/dL (ref 65–99)
Potassium: 4.1 mmol/L (ref 3.5–5.3)
Sodium: 140 mmol/L (ref 135–146)
Total Bilirubin: 1 mg/dL (ref 0.2–1.2)
Total Protein: 7.2 g/dL (ref 6.1–8.1)
eGFR: 86 mL/min/{1.73_m2} (ref >=60)

## 2022-05-31 LAB — LIPID PANEL
Cholesterol: 180 mg/dL (ref <200)
HDL: 48 mg/dL (ref >=40)
LDL Cholesterol (Calc): 107 mg/dL (calc) — ABNORMAL HIGH
Non-HDL Cholesterol (Calc): 132 mg/dL (calc) — ABNORMAL HIGH (ref <130)
Total CHOL/HDL Ratio: 3.8 (calc) (ref <5.0)
Triglycerides: 130 mg/dL (ref <150)

## 2022-06-08 ENCOUNTER — Telehealth: Payer: BC Managed Care – PPO | Admitting: Physician Assistant

## 2022-06-08 DIAGNOSIS — G43901 Migraine, unspecified, not intractable, with status migrainosus: Secondary | ICD-10-CM | POA: Diagnosis not present

## 2022-06-08 MED ORDER — PREDNISONE 10 MG (21) PO TBPK: ORAL_TABLET | ORAL | 0 refills | Status: DC

## 2022-06-08 NOTE — Progress Notes (Signed)
Virtual Visit Consent   Don Patterson, you are scheduled for a virtual visit with a [facility] provider today. Just as with appointments in the office, your consent must be obtained to participate. Your consent will be active for this visit and any virtual visit you may have with one of our providers in the next 365 days. If you have a MyChart account, a copy of this consent can be sent to you electronically.  As this is a virtual visit, video technology does not allow for your provider to perform a traditional examination. This may limit your provider's ability to fully assess your condition. If your provider identifies any concerns that need to be evaluated in person or the need to arrange testing (such as labs, EKG, etc.), we will make arrangements to do so. Although advances in technology are sophisticated, we cannot ensure that it will always work on either your end or our end. If the connection with a video visit is poor, the visit may have to be switched to a telephone visit. With either a video or telephone visit, we are not always able to ensure that we have a secure connection.  By engaging in this virtual visit, you consent to the provision of healthcare and authorize for your insurance to be billed (if applicable) for the services provided during this visit. Depending on your insurance coverage, you may receive a charge related to this service.  I need to obtain your verbal consent now. Are you willing to proceed with your visit today? Don Patterson has provided verbal consent on 06/08/2022 for a virtual visit (video or telephone). Mar Daring, PA-C  Date: 06/08/2022 1:41 PM  Virtual Visit via Video Note   I, Mar Daring, connected with  Don Patterson  ([id number], 1970-10-01) on 06/08/22 at  1:30 PM EST by a video-enabled telemedicine application and verified that I am speaking with the correct person using two identifiers.  Location: Patient: Virtual Visit  Location Patient: Home Provider: Virtual Visit Location Provider: Home Office   I discussed the limitations of evaluation and management by telemedicine and the availability of in person appointments. The patient expressed understanding and agreed to proceed.    History of Present Illness: Don Patterson is a 51 y.o. who identifies as a male who was assigned male at birth, and is being seen today for possible sinus infection.  HPI: Sinusitis This is a new problem. The current episode started in the past 7 days (Saturday was first migraine, then mild residual symptoms on Sunday; Monday was okay, but had complex migraine symptoms return yesterday and linger today). The problem is unchanged. There has been no fever. Associated symptoms include headaches, neck pain and sinus pressure. Pertinent negatives include no congestion, coughing, diaphoresis, ear pain or sore throat. (Saturday evening had an ocular migraine, pressure behind right eye, then yesterday had pressure continued and other migraine symptoms. Does have h/o complex migraines when in HS. Having pain in the base of the neck and abnormal sensations on the right side similar to previous complex migraines) Treatments tried: has tried sudafed and excedrin migraine; sudafed has helped. The treatment provided no relief.     Problems:  Patient Active Problem List   Diagnosis Date Noted   Rectal bleeding    Polyp of colon    Dyspepsia    Abdominal bloating    Allergic rhinitis 11/20/2019   Overweight (BMI 25.0-29.9) 05/17/2018   Hyperlipidemia 05/18/2016   Migraine aura without headache  01/14/2015   Gastroesophageal reflux disease without esophagitis 01/14/2015    Allergies:  Allergies  Allergen Reactions   Cat Hair Extract    Dust Mite Extract Other (See Comments)   Tree Extract    Medications:  Current Outpatient Medications:    predniSONE (STERAPRED UNI-PAK 21 TAB) 10 MG (21) TBPK tablet, 6 day taper; take as directed on  package instructions, Disp: 21 tablet, Rfl: 0   atorvastatin (LIPITOR) 20 MG tablet, TAKE 1 TABLET BY MOUTH EVERYDAY AT BEDTIME, Disp: 90 tablet, Rfl: 3   Flaxseed, Linseed, (FLAX SEEDS PO), Take by mouth daily., Disp: , Rfl:    GuaiFENesin (MUCINEX PO), Take as needed by mouth., Disp: , Rfl:    levocetirizine (XYZAL) 5 MG tablet, TAKE 1 TABLET BY MOUTH DAILY AS NEEDED FOR ALLERGIES., Disp: 90 tablet, Rfl: 3   montelukast (SINGULAIR) 10 MG tablet, TAKE 1 TABLET BY MOUTH EVERYDAY AT BEDTIME, Disp: 90 tablet, Rfl: 3   Omega-3 Fatty Acids (FISH OIL PO), Take by mouth daily., Disp: , Rfl:    pantoprazole (PROTONIX) 40 MG tablet, TAKE 1 TABLET (40 MG TOTAL) BY MOUTH 2 (TWO) TIMES DAILY BEFORE A MEAL. ON AN EMPTY STOMACH, Disp: 180 tablet, Rfl: 0   sodium chloride (OCEAN) 0.65 % SOLN nasal spray, Place 1 spray into both nostrils as needed for congestion., Disp: , Rfl:    triamcinolone (NASACORT) 55 MCG/ACT AERO nasal inhaler, Place 2 sprays daily into the nose. , Disp: , Rfl:    Observations/Objective: Patient is well-developed, well-nourished in no acute distress.  Resting comfortably at home.  Head is normocephalic, atraumatic.  No labored breathing.  Speech is clear and coherent with logical content.  Patient is alert and oriented at baseline.    Assessment and Plan: 1. Migraine with status migrainosus, not intractable, unspecified migraine type - predniSONE (STERAPRED UNI-PAK 21 TAB) 10 MG (21) TBPK tablet; 6 day taper; take as directed on package instructions  Dispense: 21 tablet; Refill: 0  - Prednisone prescribed to break migraine cycle - Excedrin migraine can be continued as needed - Push fluids - Rest - Seek in person evaluation if symptoms worsen or fail to improve with treatment  Follow Up Instructions: I discussed the assessment and treatment plan with the patient. The patient was provided an opportunity to ask questions and all were answered. The patient agreed with the plan  and demonstrated an understanding of the instructions.  A copy of instructions were sent to the patient via MyChart unless otherwise noted below.    The patient was advised to call back or seek an in-person evaluation if the symptoms worsen or if the condition fails to improve as anticipated.  Time:  I spent 13 minutes with the patient via telehealth technology discussing the above problems/concerns.    Mar Daring, PA-C

## 2022-06-08 NOTE — Patient Instructions (Signed)
Nash Dimmer, thank you for joining Mar Daring, PA-C for today's virtual visit.  While this provider is not your primary care provider (PCP), if your PCP is located in our provider database this encounter information will be shared with them immediately following your visit.   [facility] account gives you access to today's visit and all your visits, tests, and labs performed at [facility] " click here if you don't have a [facility] account or go to [email or web address]  Consent: (Patient) KSHAWN CANAL provided verbal consent for this virtual visit at the beginning of the encounter.  Current Medications:  Current Outpatient Medications:    predniSONE (STERAPRED UNI-PAK 21 TAB) 10 MG (21) TBPK tablet, 6 day taper; take as directed on package instructions, Disp: 21 tablet, Rfl: 0   atorvastatin (LIPITOR) 20 MG tablet, TAKE 1 TABLET BY MOUTH EVERYDAY AT BEDTIME, Disp: 90 tablet, Rfl: 3   Flaxseed, Linseed, (FLAX SEEDS PO), Take by mouth daily., Disp: , Rfl:    GuaiFENesin (MUCINEX PO), Take as needed by mouth., Disp: , Rfl:    levocetirizine (XYZAL) 5 MG tablet, TAKE 1 TABLET BY MOUTH DAILY AS NEEDED FOR ALLERGIES., Disp: 90 tablet, Rfl: 3   montelukast (SINGULAIR) 10 MG tablet, TAKE 1 TABLET BY MOUTH EVERYDAY AT BEDTIME, Disp: 90 tablet, Rfl: 3   Omega-3 Fatty Acids (FISH OIL PO), Take by mouth daily., Disp: , Rfl:    pantoprazole (PROTONIX) 40 MG tablet, TAKE 1 TABLET (40 MG TOTAL) BY MOUTH 2 (TWO) TIMES DAILY BEFORE A MEAL. ON AN EMPTY STOMACH, Disp: 180 tablet, Rfl: 0   sodium chloride (OCEAN) 0.65 % SOLN nasal spray, Place 1 spray into both nostrils as needed for congestion., Disp: , Rfl:    triamcinolone (NASACORT) 55 MCG/ACT AERO nasal inhaler, Place 2 sprays daily into the nose. , Disp: , Rfl:    Medications ordered in this encounter:  Meds ordered this encounter  Medications   predniSONE (STERAPRED UNI-PAK 21 TAB) 10 MG (21) TBPK  tablet    Sig: 6 day taper; take as directed on package instructions    Dispense:  21 tablet    Refill:  0    Order Specific Question:   Supervising Provider    Answer:   Chase Picket [id number]     *If you need refills on other medications prior to your next appointment, please contact your pharmacy*  Follow-Up: Call back or seek an in-person evaluation if the symptoms worsen or if the condition fails to improve as anticipated.  [facility] [phone]  Other Instructions  Migraine Headache A migraine headache is an intense, throbbing pain on one side or both sides of the head. Migraine headaches may also cause other symptoms, such as nausea, vomiting, and sensitivity to light and noise. A migraine headache can last from 4 hours to 3 days. Talk with your doctor about what things may bring on (trigger) your migraine headaches. What are the causes? The exact cause of this condition is not known. However, a migraine may be caused when nerves in the brain become irritated and release chemicals that cause inflammation of blood vessels. This inflammation causes pain. This condition may be triggered or caused by: Drinking alcohol. Smoking. Taking medicines, such as: Medicine used to treat chest pain (nitroglycerin). Birth control pills. Estrogen. Certain blood pressure medicines. Eating or drinking products that contain nitrates, glutamate, aspartame, or tyramine. Aged cheeses, chocolate, or caffeine may also be triggers. Doing  physical activity. Other things that may trigger a migraine headache include: Menstruation. Pregnancy. Hunger. Stress. Lack of sleep or too much sleep. Weather changes. Fatigue. What increases the risk? The following factors may make you more likely to experience migraine headaches: Being a certain age. This condition is more common in people who are 25-79 years old. Being male. Having a family history of migraine headaches. Being  Caucasian. Having a mental health condition, such as depression or anxiety. Being obese. What are the signs or symptoms? The main symptom of this condition is pulsating or throbbing pain. This pain may: Happen in any area of the head, such as on one side or both sides. Interfere with daily activities. Get worse with physical activity. Get worse with exposure to bright lights or loud noises. Other symptoms may include: Nausea. Vomiting. Dizziness. General sensitivity to bright lights, loud noises, or smells. Before you get a migraine headache, you may get warning signs (an aura). An aura may include: Seeing flashing lights or having blind spots. Seeing bright spots, halos, or zigzag lines. Having tunnel vision or blurred vision. Having numbness or a tingling feeling. Having trouble talking. Having muscle weakness. Some people have symptoms after a migraine headache (postdromal phase), such as: Feeling tired. Difficulty concentrating. How is this diagnosed? A migraine headache can be diagnosed based on: Your symptoms. A physical exam. Tests, such as: CT scan or an MRI of the head. These imaging tests can help rule out other causes of headaches. Taking fluid from the spine (lumbar puncture) and analyzing it (cerebrospinal fluid analysis, or CSF analysis). How is this treated? This condition may be treated with medicines that: Relieve pain. Relieve nausea. Prevent migraine headaches. Treatment for this condition may also include: Acupuncture. Lifestyle changes like avoiding foods that trigger migraine headaches. Biofeedback. Cognitive behavioral therapy. Follow these instructions at home: Medicines Take over-the-counter and prescription medicines only as told by your health care provider. Ask your health care provider if the medicine prescribed to you: Requires you to avoid driving or using heavy machinery. Can cause constipation. You may need to take these actions to  prevent or treat constipation: Drink enough fluid to keep your urine pale yellow. Take over-the-counter or prescription medicines. Eat foods that are high in fiber, such as beans, whole grains, and fresh fruits and vegetables. Limit foods that are high in fat and processed sugars, such as fried or sweet foods. Lifestyle Do not drink alcohol. Do not use any products that contain nicotine or tobacco, such as cigarettes, e-cigarettes, and chewing tobacco. If you need help quitting, ask your health care provider. Get at least 8 hours of sleep every night. Find ways to manage stress, such as meditation, deep breathing, or yoga. General instructions Keep a journal to find out what may trigger your migraine headaches. For example, write down: What you eat and drink. How much sleep you get. Any change to your diet or medicines. If you have a migraine headache: Avoid things that make your symptoms worse, such as bright lights. It may help to lie down in a dark, quiet room. Do not drive or use heavy machinery. Ask your health care provider what activities are safe for you while you are experiencing symptoms. Keep all follow-up visits as told by your health care provider. This is important. Contact a health care provider if: You develop symptoms that are different or more severe than your usual migraine headache symptoms. You have more than 15 headache days in one month. Get help right  away if: Your migraine headache becomes severe. Your migraine headache lasts longer than 72 hours. You have a fever. You have a stiff neck. You have vision loss. Your muscles feel weak or like you cannot control them. You start to lose your balance often. You have trouble walking. You faint. You have a seizure. Summary A migraine headache is an intense, throbbing pain on one side or both sides of the head. Migraines may also cause other symptoms, such as nausea, vomiting, and sensitivity to light and  noise. This condition may be treated with medicines and lifestyle changes. You may also need to avoid certain things that trigger a migraine headache. Keep a journal to find out what may trigger your migraine headaches. Contact your health care provider if you have more than 15 headache days in a month or you develop symptoms that are different or more severe than your usual migraine headache symptoms. This information is not intended to replace advice given to you by your health care provider. Make sure you discuss any questions you have with your health care provider. Document Revised: 12/02/2021 Document Reviewed: 08/02/2018 Elsevier Patient Education  [place].    If you have been instructed to have an in-person evaluation today at a local Urgent Care facility, please use the link below. It will take you to a list of all of our available [facility], including address, phone number and hours of operation. Please do not delay care.  [facility]  If you or a family member do not have a primary care provider, use the link below to schedule a visit and establish care. When you choose a [facility] primary care physician or advanced practice provider, you gain a long-term partner in health. Find a Primary Care Provider  Learn more about [facility]'s in-office and virtual care options: [facility]

## 2022-06-14 ENCOUNTER — Encounter: Payer: Self-pay | Admitting: Family Medicine

## 2022-06-14 ENCOUNTER — Ambulatory Visit: Payer: BC Managed Care – PPO | Admitting: Family Medicine

## 2022-06-14 VITALS — BP 130/74 | HR 81 | Temp 97.5°F | Resp 16 | Ht 67.5 in | Wt 198.1 lb

## 2022-06-14 DIAGNOSIS — J329 Chronic sinusitis, unspecified: Secondary | ICD-10-CM

## 2022-06-14 DIAGNOSIS — G43909 Migraine, unspecified, not intractable, without status migrainosus: Secondary | ICD-10-CM | POA: Diagnosis not present

## 2022-06-14 DIAGNOSIS — H6121 Impacted cerumen, right ear: Secondary | ICD-10-CM | POA: Diagnosis not present

## 2022-06-14 DIAGNOSIS — J309 Allergic rhinitis, unspecified: Secondary | ICD-10-CM

## 2022-06-14 MED ORDER — IPRATROPIUM BROMIDE 0.03 % NA SOLN: 2.0000 | Freq: Two times a day (BID) | NASAL | 12 refills | Status: DC

## 2022-06-14 MED ORDER — AMOXICILLIN-POT CLAVULANATE 875-125 MG PO TABS: 1.0000 | ORAL_TABLET | Freq: Two times a day (BID) | ORAL | 0 refills | Status: AC

## 2022-06-14 NOTE — Progress Notes (Signed)
Patient ID: Don Patterson, male    DOB: Oct 10, 1970, 51 y.o.   MRN: [id number]  PCP: Delsa Grana, PA-C  Chief Complaint  Patient presents with   Follow-up   Migraine    Prednisone done and still having migraines even smell causes it worst   Sinusitis    Subjective:   Don Patterson is a 51 y.o. male, presents to clinic with CC of the following:  HPI   Pt presents for f/up after virtual visit on 12/5 Was given steroid taper for sinusitis and HA's, sx now have been ongoing for about 2 weeks After finishing meds sx have worsened including HA, pressure, triggering migraines  He does take his allergy medication, Singulair, intranasal steroid spray and he has tried headache and migraine medications and Sudafed but his symptoms continue to be moderate to severe particularly worse on his right maxillary sinus with right ear pain  Patient Active Problem List   Diagnosis Date Noted   Rectal bleeding    Polyp of colon    Dyspepsia    Abdominal bloating    Allergic rhinitis 11/20/2019   Overweight (BMI 25.0-29.9) 05/17/2018   Hyperlipidemia 05/18/2016   Migraine aura without headache 01/14/2015   Gastroesophageal reflux disease without esophagitis 01/14/2015      Current Outpatient Medications:    atorvastatin (LIPITOR) 20 MG tablet, TAKE 1 TABLET BY MOUTH EVERYDAY AT BEDTIME, Disp: 90 tablet, Rfl: 3   Flaxseed, Linseed, (FLAX SEEDS PO), Take by mouth daily., Disp: , Rfl:    GuaiFENesin (MUCINEX PO), Take as needed by mouth., Disp: , Rfl:    levocetirizine (XYZAL) 5 MG tablet, TAKE 1 TABLET BY MOUTH DAILY AS NEEDED FOR ALLERGIES., Disp: 90 tablet, Rfl: 3   montelukast (SINGULAIR) 10 MG tablet, TAKE 1 TABLET BY MOUTH EVERYDAY AT BEDTIME, Disp: 90 tablet, Rfl: 3   Omega-3 Fatty Acids (FISH OIL PO), Take by mouth daily., Disp: , Rfl:    pantoprazole (PROTONIX) 40 MG tablet, TAKE 1 TABLET (40 MG TOTAL) BY MOUTH 2 (TWO) TIMES DAILY BEFORE A MEAL. ON AN EMPTY STOMACH, Disp: 180  tablet, Rfl: 0   predniSONE (STERAPRED UNI-PAK 21 TAB) 10 MG (21) TBPK tablet, 6 day taper; take as directed on package instructions, Disp: 21 tablet, Rfl: 0   sodium chloride (OCEAN) 0.65 % SOLN nasal spray, Place 1 spray into both nostrils as needed for congestion., Disp: , Rfl:    triamcinolone (NASACORT) 55 MCG/ACT AERO nasal inhaler, Place 2 sprays daily into the nose. , Disp: , Rfl:    Allergies  Allergen Reactions   Cat Hair Extract    Dust Mite Extract Other (See Comments)   Tree Extract      Social History   Tobacco Use   Smoking status: Never   Smokeless tobacco: Never  Vaping Use   Vaping Use: Never used  Substance Use Topics   Alcohol use: Yes    Alcohol/week: 0.0 standard drinks of alcohol    Comment: rarely   Drug use: No      Chart Review Today: I personally reviewed active problem list, medication list, allergies, family history, social history, health maintenance, notes from last encounter, lab results, imaging with the patient/caregiver today.   Review of Systems  Constitutional: Negative.   HENT: Negative.    Eyes: Negative.   Respiratory: Negative.    Cardiovascular: Negative.   Gastrointestinal: Negative.   Endocrine: Negative.   Genitourinary: Negative.   Musculoskeletal: Negative.   Skin: Negative.  Allergic/Immunologic: Negative.   Neurological: Negative.   Hematological: Negative.   Psychiatric/Behavioral: Negative.    All other systems reviewed and are negative.      Objective:   Vitals:   06/14/22 1352  BP: 130/74  Pulse: 81  Resp: 16  Temp: (!) 97.5 F (36.4 C)  TempSrc: Oral  SpO2: 100%  Weight: 198 lb 1.6 oz (89.9 kg)  Height: 5' 7.5" (1.715 m)    Body mass index is 30.57 kg/m.  Physical Exam Vitals and nursing note reviewed.  Constitutional:      General: He is not in acute distress.    Appearance: Normal appearance. He is well-developed. He is not toxic-appearing or diaphoretic.  HENT:     Head: Normocephalic  and atraumatic.     Jaw: No trismus.     Right Ear: External ear normal. There is impacted cerumen.     Left Ear: Tympanic membrane, ear canal and external ear normal.     Nose: Mucosal edema and rhinorrhea present.     Right Turbinates: Enlarged and swollen. Not pale.     Left Turbinates: Enlarged and swollen. Not pale.     Right Sinus: No maxillary sinus tenderness or frontal sinus tenderness.     Left Sinus: No maxillary sinus tenderness or frontal sinus tenderness.     Mouth/Throat:     Mouth: Mucous membranes are not pale, not dry and not cyanotic.     Pharynx: Uvula midline. Posterior oropharyngeal erythema present. No oropharyngeal exudate or uvula swelling.     Tonsils: No tonsillar exudate or tonsillar abscesses.  Eyes:     General: Lids are normal.        Right eye: No discharge.        Left eye: No discharge.     Conjunctiva/sclera: Conjunctivae normal.     Pupils: Pupils are equal, round, and reactive to light.  Neck:     Trachea: Trachea and phonation normal. No tracheal deviation.  Cardiovascular:     Rate and Rhythm: Normal rate and regular rhythm.     Pulses:          Radial pulses are 2+ on the right side and 2+ on the left side.     Heart sounds: Normal heart sounds. No murmur heard.    No friction rub. No gallop.  Pulmonary:     Effort: Pulmonary effort is normal. No tachypnea, accessory muscle usage or respiratory distress.     Breath sounds: Normal breath sounds. No stridor. No decreased breath sounds, wheezing, rhonchi or rales.  Abdominal:     General: Bowel sounds are normal. There is no distension.     Palpations: Abdomen is soft.     Tenderness: There is no abdominal tenderness.  Musculoskeletal:        General: Normal range of motion.     Cervical back: Normal range of motion and neck supple.  Skin:    General: Skin is warm and dry.     Capillary Refill: Capillary refill takes less than 2 seconds.     Coloration: Skin is not pale.     Findings: No  rash.     Nails: There is no clubbing.  Neurological:     Mental Status: He is alert and oriented to person, place, and time.     Motor: No abnormal muscle tone.     Coordination: Coordination normal.     Gait: Gait normal.  Psychiatric:        Speech: Speech  normal.        Behavior: Behavior normal. Behavior is cooperative.      Results for orders placed or performed in visit on 05/30/22  CBC with Differential/Platelet  Result Value Ref Range   WBC 5.2 3.8 - 10.8 Thousand/uL   RBC 5.18 4.20 - 5.80 Million/uL   Hemoglobin 16.1 13.2 - 17.1 g/dL   HCT 47.0 38.5 - 50.0 %   MCV 90.7 80.0 - 100.0 fL   MCH 31.1 27.0 - 33.0 pg   MCHC 34.3 32.0 - 36.0 g/dL   RDW 12.9 11.0 - 15.0 %   Platelets 224 140 - 400 Thousand/uL   MPV 11.2 7.5 - 12.5 fL   Neutro Abs 2,595 1,500 - 7,800 cells/uL   Lymphs Abs 1,914 850 - 3,900 cells/uL   Absolute Monocytes 525 200 - 950 cells/uL   Eosinophils Absolute 83 15 - 500 cells/uL   Basophils Absolute 83 0 - 200 cells/uL   Neutrophils Relative % 49.9 %   Total Lymphocyte 36.8 %   Monocytes Relative 10.1 %   Eosinophils Relative 1.6 %   Basophils Relative 1.6 %  COMPLETE METABOLIC PANEL WITH GFR  Result Value Ref Range   Glucose, Bld 95 65 - 99 mg/dL   BUN 11 7 - 25 mg/dL   Creat 1.06 0.60 - 1.29 mg/dL   eGFR 86 > OR = 60 mL/min/1.17m   BUN/Creatinine Ratio SEE NOTE: 6 - 22 (calc)   Sodium 140 135 - 146 mmol/L   Potassium 4.1 3.5 - 5.3 mmol/L   Chloride 105 98 - 110 mmol/L   CO2 28 20 - 32 mmol/L   Calcium 9.5 8.6 - 10.3 mg/dL   Total Protein 7.2 6.1 - 8.1 g/dL   Albumin 4.9 3.6 - 5.1 g/dL   Globulin 2.3 1.9 - 3.7 g/dL (calc)   AG Ratio 2.1 1.0 - 2.5 (calc)   Total Bilirubin 1.0 0.2 - 1.2 mg/dL   Alkaline phosphatase (APISO) 62 36 - 130 U/L   AST 20 10 - 40 U/L   ALT 49 (H) 9 - 46 U/L  Lipid panel  Result Value Ref Range   Cholesterol 180 <200 mg/dL   HDL 48 > OR = 40 mg/dL   Triglycerides 130 <150 mg/dL   LDL Cholesterol (Calc) 107  (H) mg/dL (calc)   Total CHOL/HDL Ratio 3.8 <5.0 (calc)   Non-HDL Cholesterol (Calc) 132 (H) <130 mg/dL (calc)       Assessment & Plan:     ICD-10-CM   1. 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 Allergic rhinitis, unspecified seasonality, unspecified trigger  J30.9 Ambulatory referral to ENT   he is on allergy meds and chronically has edematous inflamed erythematous nasal mucosa and posterior op, increase/adjust meds    3. Right ear impacted cerumen  H61.21    not completely obstructed he will use Debrox, currently no ear pain or hearing decrease    4. Migraine without status migrainosus, not intractable, unspecified migraine type  G43.909    Sinus pain and pressure triggering "ocular" migraines per pt, one sided, phonophobia, using excedrine, can use nsaids too prn     With uncontrolled baseline allergies and nasal and posterior oropharyngeal edema and redness patient was advised to adjust his  day-to-day antihistamines by adding a second antihistamine or switching his antihistamines, encouraged to continue his intranasal steroid spray he can try an intranasal antihistamine or he may try the Atrovent which may dry him out he has profuse clear nasal discharge, much more swelling on the right side with virtually no room for breathing, symptoms are much worse on his right side versus his left, he has also tried decongestants, at this time with starting antibiotics I have asked him not to take any more Sudafed but other times when allergies or a cold begins he can try Sudafed or perhaps sometimes during the year he may need a Claritin-D in addition to a nighttime antihistamine  Discussed possibly getting a CT of his sinuses or consult  with ENT or an allergist Patient would like to follow-up with ENT  f/up if not improving over the next week   Rx for 10 d abx, but can do 7 and stop if sig improved  Instructions to pt on AVS: I recommend doubling up on or adjusting your antihistamines such as trying Allegra and Xyzal or combination of 2 different ones twice a day We can try different steroid or antihistamine nasal sprays that may also control your nasal symptoms much better You should continue your Singulair On exam you usually have a lot of swelling and redness which is impressive and it does not usually correlate with you having bad symptoms or not so I do suspect that we have your environmental or seasonal allergies under control to begin with.  You likely need antibiotics at this point with such severe symptoms for so long.  I would recommend doing the antibiotic treatment and then following up with the ENT for further evaluation and recommendation for treatment of your chronic and recurrent rhinosinusitis.   Delsa Grana, PA-C 06/14/22 2:06 PM

## 2022-06-14 NOTE — Patient Instructions (Signed)
I recommend doubling up on or adjusting your antihistamines such as trying Allegra and Xyzal or combination of 2 different ones twice a day We can try different steroid or antihistamine nasal sprays that may also control your nasal symptoms much better You should continue your Singulair On exam you usually have a lot of swelling and redness which is impressive and it does not usually correlate with you having bad symptoms or not so I do suspect that we have your environmental or seasonal allergies under control to begin with.  You likely need antibiotics at this point with such severe symptoms for so long.  I would recommend doing the antibiotic treatment and then following up with the ENT for further evaluation and recommendation for treatment of your chronic and recurrent rhinosinusitis.

## 2022-07-02 ENCOUNTER — Other Ambulatory Visit: Payer: Self-pay | Admitting: Gastroenterology

## 2022-07-02 DIAGNOSIS — K219 Gastro-esophageal reflux disease without esophagitis: Secondary | ICD-10-CM

## 2022-08-10 ENCOUNTER — Encounter: Payer: Self-pay | Admitting: Gastroenterology

## 2022-08-10 ENCOUNTER — Ambulatory Visit: Payer: BC Managed Care – PPO | Admitting: Gastroenterology

## 2022-08-10 VITALS — BP 132/78 | HR 64 | Temp 97.8°F | Ht 67.5 in | Wt 206.1 lb

## 2022-08-10 DIAGNOSIS — K219 Gastro-esophageal reflux disease without esophagitis: Secondary | ICD-10-CM | POA: Diagnosis not present

## 2022-08-10 NOTE — Progress Notes (Signed)
Cephas Darby, MD [address]  [place], [place] [address]  Main: [phone]  Fax: [phone]    Gastroenterology Consultation  Referring Provider:     Delsa Grana, PA-C Primary Care Physician:  Delsa Grana, PA-C Primary Gastroenterologist:  Dr. Cephas Darby Reason for Consultation: Chronic GERD        HPI:   Don Patterson is a 52 y.o. male referred by Delsa Grana, PA-C  for consultation & management of abdominal bloating, blood in stool.  Patient reports several months history of abdominal bloating, gas with bowel movements of variable consistency.  He thought his symptoms are stress related.  He also had 1 episode of rectal pain with pressure and bright red blood per rectum.  This was last year.  He does not feel normal in his stomach.  He has been avoiding lactose and maintains a food diary.  He noticed that broccoli and cabbage resulted in worsening of abdominal bloating.  His labs including CBC, CMP, celiac panel came back unremarkable.  Patient does not smoke or drink alcohol.  He drinks mostly water.  He is a Environmental consultant at [employer]  He has history of postnasal drip, takes Protonix 20 mg at bedtime  Follow-up visit 08/10/2022 Don Patterson is here for follow-up of chronic GERD.  It has been more than 1 year since I saw him in the office.  He reports that his reflux symptoms are under control.  His EGD was unremarkable.  There was no evidence of eosinophilic esophagitis.  He continues to take Protonix 40 mg twice daily because he was not sure if he could decrease the dose on his own.  NSAIDs: None  Antiplts/Anticoagulants/Anti thrombotics: None  GI Procedures:  EGD and colonoscopy 04/19/2021 DIAGNOSIS: A. DUODENUM; COLD BIOPSY: - DUODENAL MUCOSA WITH NO SIGNIFICANT PATHOLOGIC ALTERATION. - NEGATIVE FOR FEATURES OF CELIAC DISEASE. - NEGATIVE FOR DYSPLASIA AND MALIGNANCY.  B. STOMACH; COLD BIOPSY: - OXYNTIC MUCOSA WITH NO  SIGNIFICANT PATHOLOGIC ALTERATION. - NEGATIVE FOR ACTIVE INFLAMMATION AND H PYLORI. - NEGATIVE FOR INTESTINAL METAPLASIA, DYSPLASIA, AND MALIGNANCY.  C. ESOPHAGUS; COLD BIOPSY: - SQUAMOCOLUMNAR JUNCTION WITH NO SIGNIFICANT PATHOLOGIC ALTERATION. - NEGATIVE FOR INCREASED EOSINOPHILS. - NEGATIVE FOR INTESTINAL METAPLASIA, DYSPLASIA, AND MALIGNANCY.  D. COLON POLYP, CECUM; COLD SNARE: - HYPERPLASTIC POLYP. - NEGATIVE FOR DYSPLASIA AND MALIGNANCY.  Paternal grandfather passed away from colon cancer  Past Medical History:  Diagnosis Date   Allergy    Bilateral shoulder pain 01/14/2015   Colitis    History of kidney stones    Hyperlipidemia LDL goal <130 05/18/2016   Start statin Nov 2017   Hypertension    Medication monitoring encounter 05/18/2016   Migraine    Palpitations 03/29/2015   Sinus congestion     Past Surgical History:  Procedure Laterality Date   COLONOSCOPY WITH PROPOFOL N/A 04/19/2021   Procedure: COLONOSCOPY WITH PROPOFOL;  Surgeon: Lin Landsman, MD;  Location: [facility] ENDOSCOPY;  Service: Gastroenterology;  Laterality: N/A;   CYST REMOVAL NECK Left 07/04/1997   face   ESOPHAGOGASTRODUODENOSCOPY N/A 04/19/2021   Procedure: ESOPHAGOGASTRODUODENOSCOPY (EGD);  Surgeon: Lin Landsman, MD;  Location[facility] ENDOSCOPY;  Service: Gastroenterology;  Laterality: N/A;   WISDOM TOOTH EXTRACTION      Current Outpatient Medications:    atorvastatin (LIPITOR) 20 MG tablet, TAKE 1 TABLET BY MOUTH EVERYDAY AT BEDTIME, Disp: 90 tablet, Rfl: 3   Flaxseed, Linseed, (FLAX SEEDS PO), Take by mouth daily., Disp: , Rfl:  GuaiFENesin (MUCINEX PO), Take as needed by mouth., Disp: , Rfl:    ipratropium (ATROVENT) 0.03 % nasal spray, Place 2 sprays into both nostrils every 12 (twelve) hours., Disp: 30 mL, Rfl: 12   levocetirizine (XYZAL) 5 MG tablet, TAKE 1 TABLET BY MOUTH DAILY AS NEEDED FOR ALLERGIES., Disp: 90 tablet, Rfl: 3   montelukast (SINGULAIR) 10 MG tablet, TAKE 1  TABLET BY MOUTH EVERYDAY AT BEDTIME, Disp: 90 tablet, Rfl: 3   Omega-3 Fatty Acids (FISH OIL PO), Take by mouth daily., Disp: , Rfl:    pantoprazole (PROTONIX) 40 MG tablet, TAKE 1 TABLET (40 MG TOTAL) BY MOUTH 2 (TWO) TIMES DAILY BEFORE A MEAL. ON AN EMPTY STOMACH, Disp: 180 tablet, Rfl: 0   sodium chloride (OCEAN) 0.65 % SOLN nasal spray, Place 1 spray into both nostrils as needed for congestion., Disp: , Rfl:    triamcinolone (NASACORT) 55 MCG/ACT AERO nasal inhaler, Place 2 sprays daily into the nose. , Disp: , Rfl:     Family History  Problem Relation Age of Onset   Cancer Mother        breast and ovarian   Heart disease Father    Heart disease Paternal Uncle      Social History   Tobacco Use   Smoking status: Never   Smokeless tobacco: Never  Vaping Use   Vaping Use: Never used  Substance Use Topics   Alcohol use: Yes    Alcohol/week: 0.0 standard drinks of alcohol    Comment: rarely   Drug use: No    Allergies as of 08/10/2022 - Review Complete 08/10/2022  Allergen Reaction Noted   Cat hair extract     Dust mite extract Other (See Comments)    Tree extract      Review of Systems:    All systems reviewed and negative except where noted in HPI.   Physical Exam:  BP 132/78 (BP Location: Right Arm, Patient Position: Sitting, Cuff Size: Normal)   Pulse 64   Temp 97.8 F (36.6 C) (Oral)   Ht 5' 7.5" (1.715 m)   Wt 206 lb 2 oz (93.5 kg)   BMI 31.81 kg/m  No LMP for male patient.  General:   Alert,  Well-developed, well-nourished, pleasant and cooperative in NAD Head:  Normocephalic and atraumatic. Eyes:  Sclera clear, no icterus.   Conjunctiva pink. Ears:  Normal auditory acuity. Nose:  No deformity, discharge, or lesions. Mouth:  No deformity or lesions,oropharynx pink & moist. Neck:  Supple; no masses or thyromegaly. Lungs:  Respirations even and unlabored.  Clear throughout to auscultation.   No wheezes, crackles, or rhonchi. No acute distress. Heart:   Regular rate and rhythm; no murmurs, clicks, rubs, or gallops. Abdomen:  Normal bowel sounds. Soft, non-tender and non-distended without masses, hepatosplenomegaly or hernias noted.  No guarding or rebound tenderness.   Rectal: Not performed Msk:  Symmetrical without gross deformities. Good, equal movement & strength bilaterally. Pulses:  Normal pulses noted. Extremities:  No clubbing or edema.  No cyanosis. Neurologic:  Alert and oriented x3;  grossly normal neurologically. Skin:  Intact without significant lesions or rashes. No jaundice. Psych:  Alert and cooperative. Normal mood and affect.  Imaging Studies: No abdominal imaging  Assessment and Plan:   KHASIR WOODROME is a 52 y.o. pleasant Caucasian male with no significant past medical history is seen in for follow-up of chronic symptoms of dyspepsia, abdominal bloating, gas, change in bowel habits, and episode of bright red blood per  rectum.  EGD and colonoscopy were unremarkable.  Patient continues to take Protonix 40 mg twice daily before meals.  His symptoms are under control Advised him to reduce the dose to once a day before breakfast for 1 month and then switch to over-the-counter 20 mg daily.  Discussed with him about rebound acid reflux symptoms and take Pepcid as needed.   Follow up as needed, contact via MyChart as needed   Cephas Darby, MD

## 2022-09-03 ENCOUNTER — Telehealth: Payer: BC Managed Care – PPO | Admitting: Family Medicine

## 2022-09-03 DIAGNOSIS — J309 Allergic rhinitis, unspecified: Secondary | ICD-10-CM | POA: Diagnosis not present

## 2022-09-03 MED ORDER — PREDNISONE 20 MG PO TABS: 20.0000 mg | ORAL_TABLET | Freq: Two times a day (BID) | ORAL | 0 refills | Status: AC

## 2022-09-03 NOTE — Progress Notes (Signed)
Virtual Visit Consent   Don Patterson, you are scheduled for a virtual visit with a [facility] provider today. Just as with appointments in the office, your consent must be obtained to participate. Your consent will be active for this visit and any virtual visit you may have with one of our providers in the next 365 days. If you have a MyChart account, a copy of this consent can be sent to you electronically.  As this is a virtual visit, video technology does not allow for your provider to perform a traditional examination. This may limit your provider's ability to fully assess your condition. If your provider identifies any concerns that need to be evaluated in person or the need to arrange testing (such as labs, EKG, etc.), we will make arrangements to do so. Although advances in technology are sophisticated, we cannot ensure that it will always work on either your end or our end. If the connection with a video visit is poor, the visit may have to be switched to a telephone visit. With either a video or telephone visit, we are not always able to ensure that we have a secure connection.  By engaging in this virtual visit, you consent to the provision of healthcare and authorize for your insurance to be billed (if applicable) for the services provided during this visit. Depending on your insurance coverage, you may receive a charge related to this service.  I need to obtain your verbal consent now. Are you willing to proceed with your visit today? Don Patterson has provided verbal consent on 09/03/2022 for a virtual visit (video or telephone). Don Nims, FNP  Date: 09/03/2022 12:38 PM  Virtual Visit via Video Note   I, Don Patterson, connected with  Don Patterson  ([id number], Jun 12, 1971) on 09/03/22 at 12:30 PM EST by a video-enabled telemedicine application and verified that I am speaking with the correct person using two identifiers.  Location: Patient: Virtual Visit Location Patient:  Home Provider: Virtual Visit Location Provider: Home Office   I discussed the limitations of evaluation and management by telemedicine and the availability of in person appointments. The patient expressed understanding and agreed to proceed.    History of Present Illness: Don Patterson is a 52 y.o. who identifies as a male who was assigned male at birth, and is being seen today for sinus pressure, allergy sx, taking xyzal, no fever, white drainage with occ green, no headaches. Sx for 3 days. Marland Kitchen  HPI: HPI  Problems:  Patient Active Problem List   Diagnosis Date Noted   Allergic rhinitis 11/20/2019   Overweight (BMI 25.0-29.9) 05/17/2018   Hyperlipidemia 05/18/2016   Migraine aura without headache 01/14/2015   Gastroesophageal reflux disease without esophagitis 01/14/2015    Allergies:  Allergies  Allergen Reactions   Cat Hair Extract    Dust Mite Extract Other (See Comments)   Tree Extract    Medications:  Current Outpatient Medications:    predniSONE (DELTASONE) 20 MG tablet, Take 1 tablet (20 mg total) by mouth 2 (two) times daily with a meal for 5 days., Disp: 10 tablet, Rfl: 0   atorvastatin (LIPITOR) 20 MG tablet, TAKE 1 TABLET BY MOUTH EVERYDAY AT BEDTIME, Disp: 90 tablet, Rfl: 3   Flaxseed, Linseed, (FLAX SEEDS PO), Take by mouth daily., Disp: , Rfl:    GuaiFENesin (MUCINEX PO), Take as needed by mouth., Disp: , Rfl:    ipratropium (ATROVENT) 0.03 % nasal spray, Place 2 sprays into both nostrils every  12 (twelve) hours., Disp: 30 mL, Rfl: 12   levocetirizine (XYZAL) 5 MG tablet, TAKE 1 TABLET BY MOUTH DAILY AS NEEDED FOR ALLERGIES., Disp: 90 tablet, Rfl: 3   montelukast (SINGULAIR) 10 MG tablet, TAKE 1 TABLET BY MOUTH EVERYDAY AT BEDTIME, Disp: 90 tablet, Rfl: 3   Omega-3 Fatty Acids (FISH OIL PO), Take by mouth daily., Disp: , Rfl:    pantoprazole (PROTONIX) 40 MG tablet, TAKE 1 TABLET (40 MG TOTAL) BY MOUTH 2 (TWO) TIMES DAILY BEFORE A MEAL. ON AN EMPTY STOMACH, Disp:  180 tablet, Rfl: 0   sodium chloride (OCEAN) 0.65 % SOLN nasal spray, Place 1 spray into both nostrils as needed for congestion., Disp: , Rfl:    triamcinolone (NASACORT) 55 MCG/ACT AERO nasal inhaler, Place 2 sprays daily into the nose. , Disp: , Rfl:   Observations/Objective: Patient is well-developed, well-nourished in no acute distress.  Resting comfortably  at home.  Head is normocephalic, atraumatic.  No labored breathing.  Speech is clear and coherent with logical content.  Patient is alert and oriented at baseline.    Assessment and Plan: 1. Allergic rhinitis, unspecified seasonality, unspecified trigger  Increase fluids continue xyzal and flonase. Call back if sx worsen or go to UC.   Follow Up Instructions: I discussed the assessment and treatment plan with the patient. The patient was provided an opportunity to ask questions and all were answered. The patient agreed with the plan and demonstrated an understanding of the instructions.  A copy of instructions were sent to the patient via MyChart unless otherwise noted below.     The patient was advised to call back or seek an in-person evaluation if the symptoms worsen or if the condition fails to improve as anticipated.  Time:  I spent 10 minutes with the patient via telehealth technology discussing the above problems/concerns.    Don Nims, FNP

## 2022-09-03 NOTE — Patient Instructions (Signed)

## 2022-09-08 ENCOUNTER — Encounter: Payer: Self-pay | Admitting: Otolaryngology

## 2022-09-20 NOTE — Discharge Instructions (Signed)
[facility] ENDOSCOPIC SINUS SURGERY [facility]  What is Functional Endoscopic Sinus Surgery?  The Surgery involves making the natural openings of the sinuses larger by removing the bony partitions that separate the sinuses from the nasal cavity.  The natural sinus lining is preserved as much as possible to allow the sinuses to resume normal function after the surgery.  In some patients nasal polyps (excessively swollen lining of the sinuses) may be removed to relieve obstruction of the sinus openings.  The surgery is performed through the nose using lighted scopes, which eliminates the need for incisions on the face.  A septoplasty is a different procedure which is sometimes performed with sinus surgery.  It involves straightening the boy partition that separates the two sides of your nose.  A crooked or deviated septum may need repair if is obstructing the sinuses or nasal airflow.  Turbinate reduction is also often performed during sinus surgery.  The turbinates are bony proturberances from the side walls of the nose which swell and can obstruct the nose in patients with sinus and allergy problems.  Their size can be surgically reduced to help relieve nasal obstruction.  What Can Sinus Surgery Do For Me?  Sinus surgery can reduce the frequency of sinus infections requiring antibiotic treatment.  This can provide improvement in nasal congestion, post-nasal drainage, facial pressure and nasal obstruction.  Surgery will NOT prevent you from ever having an infection again, so it usually only for patients who get infections 4 or more times yearly requiring antibiotics, or for infections that do not clear with antibiotics.  It will not cure nasal allergies, so patients with allergies may still require medication to treat their allergies after surgery. Surgery may improve headaches related to sinusitis, however, some people will continue to  require medication to control sinus headaches related to allergies.  Surgery will do nothing for other forms of headache (migraine, tension or cluster).  What Are the Risks of Endoscopic Sinus Surgery?  Current techniques allow surgery to be performed safely with little risk, however, there are rare complications that patients should be aware of.  Because the sinuses are located around the eyes, there is risk of eye injury, including blindness, though again, this would be quite rare. This is usually a result of bleeding behind the eye during surgery, which can effect vision, though there are treatments to protect the vision and prevent permanent injury. More serious complications would include bleeding inside the brain cavity or damage to the brain.This happens when the fluid around the brain leaks out into the sinus cavity.  Again, all of these complications are uncommon, and spinal fluid leaks can be safely managed surgically if they occur.  The most common complication of sinus surgery is bleeding from the nose, which may require packing or cauterization of the nose.  Patients with polyps may experience recurrence of the polyps that would require revision surgery.  Alterations of sense of smell or injury to the tear ducts are also rare complications.   What is the Surgery Like, and what is the Recovery?  The Surgery usually takes a couple of hours to perform, and is usually performed under a general anesthetic (completely asleep).  Patients are usually discharged home after a couple of hours.  Sometimes during surgery it is necessary to pack the nose to control bleeding, and the packing is left in place for 24 - 48 hours, and removed by your surgeon.  If   a septoplasty was performed during the procedure, there is often a splint placed which must be removed after 5-7 days.   Discomfort: Pain is usually mild to moderate, and can be controlled by prescription pain medication or acetaminophen (Tylenol).   Aspirin, Ibuprofen (Advil, Motrin), or Naprosyn (Aleve) should be avoided, as they can cause increased bleeding.  Most patients feel sinus pressure like they have a bad head cold for several days.  Sleeping with your head elevated can help reduce swelling and facial pressure, as can ice packs over the face.  A humidifier may be helpful to keep the mucous and blood from drying in the nose.   Diet: There are no specific diet restrictions, however, you should generally start with clear liquids and a light diet of bland foods because the anesthetic can cause some nausea.  Advance your diet depending on how your stomach feels.  Taking your pain medication with food will often help reduce stomach upset which pain medications can cause.  Nasal Saline Irrigation: It is important to remove blood clots and dried mucous from the nose as it is healing.  This is done by having you irrigate the nose at least 3 - 4 times daily with a salt water solution.  We recommend using NeilMed Sinus Rinse (available at the drug store).  Fill the squeeze bottle with the solution, bend over a sink, and insert the tip of the squeeze bottle into the nose  of an inch.  Point the tip of the squeeze bottle towards the inside corner of the eye on the same side your irrigating.  Squeeze the bottle and gently irrigate the nose.  If you bend forward as you do this, most of the fluid will flow back out of the nose, instead of down your throat.   The solution should be warm, near body temperature, when you irrigate.   Each time you irrigate, you should use a full squeeze bottle.   Note that if you are instructed to use Nasal Steroid Sprays at any time after your surgery, irrigate with saline BEFORE using the steroid spray, so you do not wash it all out of the nose. Another product, Nasal Saline Gel (such as AYR Nasal Saline Gel) can be applied in each nostril 3 - 4 times daily to moisture the nose and reduce scabbing or crusting.  Bleeding:   Bloody drainage from the nose can be expected for several days, and patients are instructed to irrigate their nose frequently with salt water to help remove mucous and blood clots.  The drainage may be dark red or brown, though some fresh blood may be seen intermittently, especially after irrigation.  Do not blow you nose, as bleeding may occur. If you must sneeze, keep your mouth open to allow air to escape through your mouth.  If heavy bleeding occurs: Irrigate the nose with saline to rinse out clots, then spray the nose 3 - 4 times with Afrin Nasal Decongestant Spray.  The spray will constrict the blood vessels to slow bleeding.  Pinch the lower half of your nose shut to apply pressure, and lay down with your head elevated.  Ice packs over the nose may help as well. If bleeding persists despite these measures, you should notify your doctor.  Do not use the Afrin routinely to control nasal congestion after surgery, as it can result in worsening congestion and may affect healing.     Activity: Return to work varies among patients. Most patients will be out   of work at least 5 - 7 days to recover.  Patient may return to work after they are off of narcotic pain medication, and feeling well enough to perform the functions of their job.  Patients must avoid heavy lifting (over 10 pounds) or strenuous physical for 2 weeks after surgery, so your employer may need to assign you to light duty, or keep you out of work longer if light duty is not possible.  NOTE: you should not drive, operate dangerous machinery, do any mentally demanding tasks or make any important legal or financial decisions while on narcotic pain medication and recovering from the general anesthetic.    Call Your Doctor Immediately if You Have Any of the Following: Bleeding that you cannot control with the above measures Loss of vision, double vision, bulging of the eye or black eyes. Fever over 101 degrees Neck stiffness with severe headache,  fever, nausea and change in mental state. You are always encouraged to call anytime with concerns, however, please call with requests for pain medication refills during office hours.  Office Endoscopy: During follow-up visits your doctor will remove any packing or splints that may have been placed and evaluate and clean your sinuses endoscopically.  Topical anesthetic will be used to make this as comfortable as possible, though you may want to take your pain medication prior to the visit.  How often this will need to be done varies from patient to patient.  After complete recovery from the surgery, you may need follow-up endoscopy from time to time, particularly if there is concern of recurrent infection or nasal polyps.  

## 2022-09-21 ENCOUNTER — Ambulatory Visit: Payer: BC Managed Care – PPO | Admitting: Anesthesiology

## 2022-09-21 ENCOUNTER — Encounter
Admission: RE | Disposition: A | Discharge status: Home / Self Care | Payer: Self-pay | Source: Home / Self Care | Attending: Otolaryngology

## 2022-09-21 ENCOUNTER — Encounter: Payer: Self-pay | Admitting: Otolaryngology

## 2022-09-21 ENCOUNTER — Ambulatory Visit
Admission: RE | Admit: 2022-09-21 | Discharge: 2022-09-21 | Disposition: A | Discharge status: Home / Self Care | Payer: BC Managed Care – PPO | Attending: Otolaryngology | Admitting: Otolaryngology

## 2022-09-21 ENCOUNTER — Other Ambulatory Visit: Payer: Self-pay

## 2022-09-21 DIAGNOSIS — J342 Deviated nasal septum: Secondary | ICD-10-CM | POA: Diagnosis present

## 2022-09-21 DIAGNOSIS — K219 Gastro-esophageal reflux disease without esophagitis: Secondary | ICD-10-CM | POA: Insufficient documentation

## 2022-09-21 DIAGNOSIS — I10 Essential (primary) hypertension: Secondary | ICD-10-CM | POA: Insufficient documentation

## 2022-09-21 DIAGNOSIS — E785 Hyperlipidemia, unspecified: Secondary | ICD-10-CM | POA: Diagnosis not present

## 2022-09-21 DIAGNOSIS — J343 Hypertrophy of nasal turbinates: Secondary | ICD-10-CM | POA: Diagnosis not present

## 2022-09-21 HISTORY — DX: Gastro-esophageal reflux disease without esophagitis: K21.9

## 2022-09-21 HISTORY — PX: TURBINATE REDUCTION: SHX6157

## 2022-09-21 HISTORY — PX: SEPTOPLASTY: SHX2393

## 2022-09-21 SURGERY — SEPTOPLASTY, NOSE
Anesthesia: General | Site: Nose | Laterality: Bilateral

## 2022-09-21 MED ORDER — OXYCODONE-ACETAMINOPHEN 5-325 MG PO TABS: 1.0000 | ORAL_TABLET | ORAL | 0 refills | Status: DC | PRN

## 2022-09-21 MED ORDER — BACITRACIN 500 UNIT/GM EX OINT
TOPICAL_OINTMENT | CUTANEOUS | Status: DC | PRN
  Administered 2022-09-21: 1 via TOPICAL

## 2022-09-21 MED ORDER — EPHEDRINE SULFATE (PRESSORS) 50 MG/ML IJ SOLN
INTRAMUSCULAR | Status: DC | PRN
  Administered 2022-09-21 (×2): 10 mg via INTRAVENOUS

## 2022-09-21 MED ORDER — AZELASTINE HCL 0.1 % NA SOLN: 1.0000 | Freq: Two times a day (BID) | NASAL | 12 refills | Status: AC

## 2022-09-21 MED ORDER — OXYMETAZOLINE HCL 0.05 % NA SOLN
NASAL | Status: DC | PRN
  Administered 2022-09-21: 1 via TOPICAL

## 2022-09-21 MED ORDER — DEXAMETHASONE SODIUM PHOSPHATE 4 MG/ML IJ SOLN
INTRAMUSCULAR | Status: DC | PRN
  Administered 2022-09-21: 8 mg via INTRAVENOUS

## 2022-09-21 MED ORDER — MIDAZOLAM HCL 5 MG/5ML IJ SOLN
INTRAMUSCULAR | Status: DC | PRN
  Administered 2022-09-21: 2 mg via INTRAVENOUS

## 2022-09-21 MED ORDER — LACTATED RINGERS IV SOLN: INTRAVENOUS | Status: DC

## 2022-09-21 MED ORDER — LIDOCAINE HCL (CARDIAC) PF 100 MG/5ML IV SOSY
PREFILLED_SYRINGE | INTRAVENOUS | Status: DC | PRN
  Administered 2022-09-21: 30 mg via INTRAVENOUS

## 2022-09-21 MED ORDER — PROPOFOL 10 MG/ML IV BOLUS
INTRAVENOUS | Status: DC | PRN
  Administered 2022-09-21: 200 mg via INTRAVENOUS

## 2022-09-21 MED ORDER — AMOXICILLIN-POT CLAVULANATE 875-125 MG PO TABS: 1.0000 | ORAL_TABLET | Freq: Two times a day (BID) | ORAL | 0 refills | Status: AC

## 2022-09-21 MED ORDER — FENTANYL CITRATE (PF) 100 MCG/2ML IJ SOLN
INTRAMUSCULAR | Status: DC | PRN
  Administered 2022-09-21: 100 ug via INTRAVENOUS

## 2022-09-21 MED ORDER — GLYCOPYRROLATE 0.2 MG/ML IJ SOLN
INTRAMUSCULAR | Status: DC | PRN
  Administered 2022-09-21: .2 mg via INTRAVENOUS

## 2022-09-21 MED ORDER — ACETAMINOPHEN 10 MG/ML IV SOLN
INTRAVENOUS | Status: DC | PRN
  Administered 2022-09-21: 1000 mg via INTRAVENOUS

## 2022-09-21 MED ORDER — SUCCINYLCHOLINE CHLORIDE 200 MG/10ML IV SOSY
PREFILLED_SYRINGE | INTRAVENOUS | Status: DC | PRN
  Administered 2022-09-21: 180 mg via INTRAVENOUS

## 2022-09-21 MED ORDER — LIDOCAINE-EPINEPHRINE 1 %-1:100000 IJ SOLN
INTRAMUSCULAR | Status: DC | PRN
  Administered 2022-09-21: 9 mL

## 2022-09-21 MED ORDER — HEMOSTATIC AGENTS (NO CHARGE) OPTIME
TOPICAL | Status: DC | PRN
  Administered 2022-09-21: 1 via TOPICAL

## 2022-09-21 MED ORDER — ONDANSETRON HCL 4 MG/2ML IJ SOLN
INTRAMUSCULAR | Status: DC | PRN
  Administered 2022-09-21: 4 mg via INTRAVENOUS

## 2022-09-21 MED ORDER — ONDANSETRON HCL 4 MG PO TABS: 4.0000 mg | ORAL_TABLET | Freq: Three times a day (TID) | ORAL | 0 refills | Status: DC | PRN

## 2022-09-21 SURGICAL SUPPLY — 27 items
ANTIFOG SOL W/FOAM PAD STRL (MISCELLANEOUS) ×1
BLADE ELECT COATED/INSUL 125 (ELECTRODE) ×1 IMPLANT
CANISTER SUCT 1200ML W/VALVE (MISCELLANEOUS) ×1 IMPLANT
COAG SUCTION FOOTSWITCH 10FR (SUCTIONS) ×2 IMPLANT
DRESSING NASL FOAM PST OP SINU (MISCELLANEOUS) IMPLANT
DRSG NASAL FOAM POST OP SINU (MISCELLANEOUS) ×1
ELECT REM PT RETURN 9FT ADLT (ELECTROSURGICAL) ×1
ELECTRODE REM PT RTRN 9FT ADLT (ELECTROSURGICAL) ×1 IMPLANT
GLOVE SURG GAMMEX PI TX LF 7.5 (GLOVE) ×2 IMPLANT
GOWN STRL REUS W/ TWL LRG LVL3 (GOWN DISPOSABLE) ×1 IMPLANT
GOWN STRL REUS W/TWL LRG LVL3 (GOWN DISPOSABLE) ×1
KIT TURNOVER KIT A (KITS) ×1 IMPLANT
NDL HYPO 25GX1X1/2 BEV (NEEDLE) ×1 IMPLANT
NEEDLE HYPO 25GX1X1/2 BEV (NEEDLE) ×1 IMPLANT
PACK ENT CUSTOM (PACKS) ×1 IMPLANT
PACKING NASAL EPIS 4X2.4 XEROG (MISCELLANEOUS) IMPLANT
PATTIES SURGICAL .5 X3 (DISPOSABLE) ×1 IMPLANT
SOLUTION ANTFG W/FOAM PAD STRL (MISCELLANEOUS) IMPLANT
SPLINT NASAL .50MM LRG (MISCELLANEOUS) IMPLANT
SPLINT NASAL SEPTAL BLV .25 LG (MISCELLANEOUS) ×1 IMPLANT
SPLINT NASAL SEPTAL BLV .50 ST (MISCELLANEOUS) IMPLANT
SUT CHROMIC 4 0 RB 1X27 (SUTURE) ×1 IMPLANT
SUT ETHILON 3-0 FS-10 30 BLK (SUTURE) ×1
SUTURE EHLN 3-0 FS-10 30 BLK (SUTURE) ×1 IMPLANT
SYR 10ML LL (SYRINGE) ×1 IMPLANT
TOWEL OR 17X26 4PK STRL BLUE (TOWEL DISPOSABLE) ×1 IMPLANT
WATER STERILE IRR 500ML POUR (IV SOLUTION) IMPLANT

## 2022-09-21 NOTE — Anesthesia Procedure Notes (Signed)
Procedure Name: Intubation Date/Time: 09/21/2022 7:36 PM  Performed by: Londell Moh, CRNAPre-anesthesia Checklist: Patient identified, Emergency Drugs available, Suction available, Patient being monitored and Timeout performed Patient Re-evaluated:Patient Re-evaluated prior to induction Oxygen Delivery Method: Circle system utilized Preoxygenation: Pre-oxygenation with 100% oxygen Induction Type: IV induction Ventilation: Mask ventilation without difficulty Laryngoscope Size: Mac and 4 Grade View: Grade I Tube type: Oral Rae Tube size: 8.0 mm Number of attempts: 2 Placement Confirmation: ETT inserted through vocal cords under direct vision, positive ETCO2 and breath sounds checked- equal and bilateral Tube secured with: Tape Dental Injury: Teeth and Oropharynx as per pre-operative assessment  Comments: 7.5 with leak changed to 8.0

## 2022-09-21 NOTE — Op Note (Signed)
..09/21/2022  10:37 AM    Don Patterson  [id number]    Pre-Op Dx:  Deviated Nasal Septum, Hypertrophic Inferior Turbinates  Post-op Dx: Same  Proc:   1) Nasal Septoplasty 2) Bilateral Partial Reduction Inferior Turbinates  3)  Right Concha bullosa reduction  Surg:  Don Patterson  Anes:  GOT  EBL:  <39ml  Comp:  none  Findings: Severe left sided septal deviation with impaction onto inferior turbinate.  Bilateral inferior turbinate soft tissue and bone hypertrophy.  Right middle turbinate concha bullosa reduced.  Procedure: With the patient in a comfortable supine position,  general orotracheal anesthesia was induced without difficulty.  The patient received preoperative Afrin spray for topical decongestion and vasoconstriction.  At an appropriate level, the patient was placed in a semi-sitting position.  Nasal vibrissae were trimmed.   1% Xylocaine with 1:100,000 epinephrine, 9 cc's, was infiltrated into the anterior floor of the nose, into the nasal spine region, into the membranous columella, and finally into the submucoperichondrial plane of the septum on both sides.  Several minutes were allowed for this to take effect.  Cottoniod pledgetts soaked in Afrin were placed into both nasal cavities and left while the patient was prepped and draped in the standard fashion.   A proper time-out was performed.    The materials were removed from the nose and observed to be intact and correct in number.  The nose was inspected with a headlight and zero degree endoscope with the findings as described above.  A left Killian incision was sharply executed and carried down to the caudal edge of the quadrangular cartilage with a 15 blade scapel.  A mucoperichondrial flap was elelvated along the quadrangular plate back to the bony-cartilaginous junction using caudal elevator and freer elevator. The mucoperiostium was then elevated along the ethmoid plate and the vomer. An itracartilagenous  incision was made using the freer elevator and a contralateral mucoperichondiral flap was elevated using a freer elevator.  Care was taken to avoid any large rents or opposing rents in the mucoperichondrial flap.  Boney spurs of the vomer and maxillary crest were removed with Takahashi forceps.  The area of cartilagenous deviation was removed with combination of freer elevator and Takahashi forceps creating a widely patent nasal cavity as well as resolution of obstruction from the cartilagenous deviation. The mucosal flaps were placed back into their anatomic position to allow visualization of the airways. The septum now sat in the midline with an improved airway.  A 4-0 Chromic was used to close the Ollie incision as well.   The inferior turbinates were then inspected.  Under endoscopic visualization, the inferior turbinates were infractured bilaterally with a Soil scientist.  A kelly clamp was attached to the anterior-inferior third of each inferior turbinate for approximately one minute.  Under endoscopic visualization, Tru-cutting forceps were used to remove the anterior-inferior third of each inferior turbinate.  Electrocautery was used to control bleeding in the area. The remaining turbinate was then outfractured to open up the airway further. There was no significant bleeding noted. The right turbinate was then trimmed and outfractured in a similar fashion.  The airways were then visualized and showed open passageways on both sides that were significantly improved compared to before surgery.       At this point, attention was directed to the patient's right middle turbinate reduction.  This was noted to be narrowing the [facility] with a concha bullosa.  A kelly clamp was attached to the middle turbinate inferiorly.  This was left in place for 1 minute.  Using a biting forceps, the inferior aspect of the middle turbinate was removed and cauterized with middle turbinate.  Xerogel was placed laterally to  the right middle turbinate.  There was no signifcant bleeding. Nasal splints were applied to both sides of the septum using Xomed 0.54mm regular sized splints that were trimmed, and then held in position with a 3-0 Nylon through and through suture.  Stamberger sinufoam was placed along the cut edge of the inferior turbinates bilaterally.  The patient was turned back over to anesthesia, and awakened, extubated, and taken to the PACU in satisfactory condition.  Dispo:   PACU to home  Plan: Ice, elevation, narcotic analgesia, steroid taper, and prophylactic antibiotics for the duration of indwelling nasal foreign bodies.  We will reevaluate the patient in the office in 6 days and remove the septal splints.  Return to work in 10 days, strenuous activities in two weeks.   Don Patterson 09/21/2022 10:37 AM

## 2022-09-21 NOTE — Anesthesia Preprocedure Evaluation (Signed)
Anesthesia Evaluation  Patient identified by MRN, date of birth, ID band Patient awake    Reviewed: Allergy & Precautions, NPO status , Patient's Chart, lab work & pertinent test results  History of Anesthesia Complications Negative for: history of anesthetic complications  Airway Mallampati: III  TM Distance: >3 FB Neck ROM: full    Dental  (+) Chipped   Pulmonary neg pulmonary ROS, neg shortness of breath   Pulmonary exam normal        Cardiovascular Exercise Tolerance: Good hypertension, (-) angina (-) Past MI Normal cardiovascular exam     Neuro/Psych  Headaches  negative psych ROS   GI/Hepatic Neg liver ROS,GERD  Controlled,,  Endo/Other  negative endocrine ROS    Renal/GU      Musculoskeletal   Abdominal   Peds  Hematology negative hematology ROS (+)   Anesthesia Other Findings Past Medical History: No date: Allergy 01/14/2015: Bilateral shoulder pain No date: Colitis No date: GERD (gastroesophageal reflux disease) No date: History of kidney stones 05/18/2016: Hyperlipidemia LDL goal <130     Comment:  Start statin Nov 2017 No date: Hypertension 05/18/2016: Medication monitoring encounter No date: Migraine 03/29/2015: Palpitations No date: Sinus congestion  Past Surgical History: 04/19/2021: COLONOSCOPY WITH PROPOFOL; N/A     Comment:  Procedure: COLONOSCOPY WITH PROPOFOL;  Surgeon: Lin Landsman, MD;  Location: [facility] ENDOSCOPY;  Service:               Gastroenterology;  Laterality: N/A; 07/04/1997: CYST REMOVAL NECK; Left     Comment:  face 04/19/2021: ESOPHAGOGASTRODUODENOSCOPY; N/A     Comment:  Procedure: ESOPHAGOGASTRODUODENOSCOPY (EGD);  Surgeon:               Lin Landsman, MD;  Location: [facility] ENDOSCOPY;                Service: Gastroenterology;  Laterality: N/A; No date: WISDOM TOOTH EXTRACTION  BMI    Body Mass Index: 28.70 kg/m       Reproductive/Obstetrics negative OB ROS                             Anesthesia Physical Anesthesia Plan  ASA: 2  Anesthesia Plan: General ETT   Post-op Pain Management:    Induction: Intravenous  PONV Risk Score and Plan: Ondansetron, Dexamethasone, Midazolam and Treatment may vary due to age or medical condition  Airway Management Planned: Oral ETT  Additional Equipment:   Intra-op Plan:   Post-operative Plan: Extubation in OR  Informed Consent: I have reviewed the patients History and Physical, chart, labs and discussed the procedure including the risks, benefits and alternatives for the proposed anesthesia with the patient or authorized representative who has indicated his/her understanding and acceptance.     Dental Advisory Given  Plan Discussed with: Anesthesiologist, CRNA and Surgeon  Anesthesia Plan Comments: (Patient consented for risks of anesthesia including but not limited to:  - adverse reactions to medications - damage to eyes, teeth, lips or other oral mucosa - nerve damage due to positioning  - sore throat or hoarseness - Damage to heart, brain, nerves, lungs, other parts of body or loss of life  Patient voiced understanding.)       Anesthesia Quick Evaluation

## 2022-09-21 NOTE — Anesthesia Postprocedure Evaluation (Signed)
Anesthesia Post Note  Patient: Don Patterson  Procedure(s) Performed: SEPTOPLASTY (Bilateral: Nose) BILATERAL INFERIOR TURBINATE REDUCTION RIGHT MIDDLE TURBINATE REDUCTION (Bilateral: Nose)  Patient location during evaluation: PACU Anesthesia Type: General Level of consciousness: awake and alert Pain management: pain level controlled Vital Signs Assessment: post-procedure vital signs reviewed and stable Respiratory status: spontaneous breathing, nonlabored ventilation, respiratory function stable and patient connected to nasal cannula oxygen Cardiovascular status: blood pressure returned to baseline and stable Postop Assessment: no apparent nausea or vomiting Anesthetic complications: no   No notable events documented.   Last Vitals:  Vitals:   09/21/22 1115 09/21/22 1120  BP: (!) 163/88   Pulse: 77 74  Resp: 11 13  Temp:    SpO2: 99% 100%    Last Pain:  Vitals:   09/21/22 1115  TempSrc:   PainSc: 0-No pain                 Precious Haws Ferdinand Revoir

## 2022-09-21 NOTE — Transfer of Care (Signed)
Immediate Anesthesia Transfer of Care Note  Patient: Don Patterson  Procedure(s) Performed: SEPTOPLASTY (Bilateral: Nose) BILATERAL INFERIOR TURBINATE REDUCTION RIGHT MIDDLE TURBINATE REDUCTION (Bilateral: Nose)  Patient Location: PACU  Anesthesia Type: General ETT  Level of Consciousness: awake, alert  and patient cooperative  Airway and Oxygen Therapy: Patient Spontanous Breathing and Patient connected to supplemental oxygen  Post-op Assessment: Post-op Vital signs reviewed, Patient's Cardiovascular Status Stable, Respiratory Function Stable, Patent Airway and No signs of Nausea or vomiting  Post-op Vital Signs: Reviewed and stable  Complications: No notable events documented.

## 2022-09-21 NOTE — H&P (Signed)
..  History and Physical paper copy reviewed and updated date of procedure and will be scanned into system.  Patient seen and examined.  

## 2022-09-22 ENCOUNTER — Encounter: Payer: Self-pay | Admitting: Otolaryngology

## 2022-09-29 ENCOUNTER — Other Ambulatory Visit: Payer: Self-pay | Admitting: Gastroenterology

## 2022-09-29 DIAGNOSIS — K219 Gastro-esophageal reflux disease without esophagitis: Secondary | ICD-10-CM

## 2022-09-29 NOTE — Telephone Encounter (Signed)
Patient continues to take Protonix 40 mg twice daily before meals.  His symptoms are under control Advised him to reduce the dose to once a day before breakfast for 1 month and then switch to over-the-counter 20 mg daily.  Discussed with him about rebound acid reflux symptoms and take Pepcid as needed.

## 2022-11-10 ENCOUNTER — Other Ambulatory Visit: Payer: Self-pay | Admitting: Family Medicine

## 2022-11-10 DIAGNOSIS — J309 Allergic rhinitis, unspecified: Secondary | ICD-10-CM

## 2022-11-10 NOTE — Telephone Encounter (Signed)
Requested Prescriptions  Pending Prescriptions Disp Refills   levocetirizine (XYZAL) 5 MG tablet [Pharmacy Med Name: LEVOCETIRIZINE 5 MG TABLET] 90 tablet 2    Sig: TAKE 1 TABLET BY MOUTH DAILY AS NEEDED FOR ALLERGIES.     Ear, Nose, and Throat:  Antihistamines - levocetirizine dihydrochloride Passed - 11/10/2022  2:24 AM      Passed - Cr in normal range and within 360 days    Creat  Date Value Ref Range Status  05/30/2022 1.06 0.60 - 1.29 mg/dL Final         Passed - eGFR is 10 or above and within 360 days    GFR, Est African American  Date Value Ref Range Status  01/08/2021 94 > OR = 60 mL/min/1.30m2 Final   GFR, Est Non African American  Date Value Ref Range Status  01/08/2021 81 > OR = 60 mL/min/1.50m2 Final   eGFR  Date Value Ref Range Status  05/30/2022 86 > OR = 60 mL/min/1.48m2 Final         Passed - Valid encounter within last 12 months    Recent Outpatient Visits           4 months ago Recurrent rhinosinusitis   [facility] Danelle Berry, PA-C   5 months ago Annual physical exam   [facility] Danelle Berry, PA-C   1 year ago Routine general medical examination at a health care facility   [facility] Caro Laroche, DO   1 year ago Abdominal pain, unspecified abdominal location   [facility] Danelle Berry, PA-C   2 years ago Adult general medical exam   [facility] Danelle Berry, PA-C       Future Appointments             In 6 months Danelle Berry, PA-C [facility]             montelukast (SINGULAIR) 10 MG tablet [Pharmacy Med Name: MONTELUKAST SOD 10 MG TABLET] 90 tablet 2    Sig: TAKE 1 TABLET BY MOUTH EVERYDAY AT BEDTIME     Pulmonology:  Leukotriene Inhibitors Passed - 11/10/2022  2:24 AM      Passed - Valid encounter within last 12 months    Recent Outpatient Visits           4  months ago Recurrent rhinosinusitis   [facility] Danelle Berry, PA-C   5 months ago Annual physical exam   [facility] Danelle Berry, PA-C   1 year ago Routine general medical examination at a health care facility   [facility] Caro Laroche, DO   1 year ago Abdominal pain, unspecified abdominal location   [facility] Danelle Berry, PA-C   2 years ago Adult general medical exam   [facility] Danelle Berry, PA-C       Future Appointments             In 6 months Danelle Berry, PA-C [facility]

## 2023-02-04 ENCOUNTER — Telehealth: Payer: BC Managed Care – PPO | Admitting: Family Medicine

## 2023-02-04 DIAGNOSIS — B9689 Other specified bacterial agents as the cause of diseases classified elsewhere: Secondary | ICD-10-CM

## 2023-02-04 DIAGNOSIS — J019 Acute sinusitis, unspecified: Secondary | ICD-10-CM

## 2023-02-04 MED ORDER — AMOXICILLIN-POT CLAVULANATE 875-125 MG PO TABS: 1.0000 | ORAL_TABLET | Freq: Two times a day (BID) | ORAL | 0 refills | Status: DC

## 2023-02-04 NOTE — Progress Notes (Signed)
Virtual Visit Consent   Don Patterson, you are scheduled for a virtual visit with a [facility] provider today. Just as with appointments in the office, your consent must be obtained to participate. Your consent will be active for this visit and any virtual visit you may have with one of our providers in the next 365 days. If you have a MyChart account, a copy of this consent can be sent to you electronically.  As this is a virtual visit, video technology does not allow for your provider to perform a traditional examination. This may limit your provider's ability to fully assess your condition. If your provider identifies any concerns that need to be evaluated in person or the need to arrange testing (such as labs, EKG, etc.), we will make arrangements to do so. Although advances in technology are sophisticated, we cannot ensure that it will always work on either your end or our end. If the connection with a video visit is poor, the visit may have to be switched to a telephone visit. With either a video or telephone visit, we are not always able to ensure that we have a secure connection.  By engaging in this virtual visit, you consent to the provision of healthcare and authorize for your insurance to be billed (if applicable) for the services provided during this visit. Depending on your insurance coverage, you may receive a charge related to this service.  I need to obtain your verbal consent now. Are you willing to proceed with your visit today? Don Patterson has provided verbal consent on 02/04/2023 for a virtual visit (video or telephone). Georgana Curio, FNP  Date: 02/04/2023 10:50 AM  Virtual Visit via Video Note   I, Georgana Curio, connected with  Don Patterson  ([id number], 05/26/1971) on 02/04/23 at 10:45 AM EDT by a video-enabled telemedicine application and verified that I am speaking with the correct person using two identifiers.  Location: Patient: Virtual Visit Location Patient:  Home Provider: Virtual Visit Location Provider: Home Office   I discussed the limitations of evaluation and management by telemedicine and the availability of in person appointments. The patient expressed understanding and agreed to proceed.    History of Present Illness: Don Patterson is a 52 y.o. who identifies as a male who was assigned male at birth, and is being seen today for sinus pressure and pain with post nasal drainage, and headaches for 6 days worsening. No fever. He has had overall feelings of malaise. He missed 2 days of work this week and is no better. Marland Kitchen  HPI: HPI  Problems:  Patient Active Problem List   Diagnosis Date Noted   Allergic rhinitis 11/20/2019   Overweight (BMI 25.0-29.9) 05/17/2018   Hyperlipidemia 05/18/2016   Migraine aura without headache 01/14/2015   Gastroesophageal reflux disease without esophagitis 01/14/2015    Allergies:  Allergies  Allergen Reactions   Cat Hair Extract    Dust Mite Extract Other (See Comments)   Tree Extract    Medications:  Current Outpatient Medications:    amoxicillin-clavulanate (AUGMENTIN) 875-125 MG tablet, Take 1 tablet by mouth 2 (two) times daily., Disp: 20 tablet, Rfl: 0   atorvastatin (LIPITOR) 20 MG tablet, TAKE 1 TABLET BY MOUTH EVERYDAY AT BEDTIME, Disp: 90 tablet, Rfl: 3   azelastine (ASTELIN) 0.1 % nasal spray, Place 1 spray into both nostrils 2 (two) times daily. Use in each nostril as directed, Disp: 30 mL, Rfl: 12   Flaxseed, Linseed, (FLAX SEEDS PO), Take  by mouth daily., Disp: , Rfl:    GuaiFENesin (MUCINEX PO), Take as needed by mouth., Disp: , Rfl:    levocetirizine (XYZAL) 5 MG tablet, TAKE 1 TABLET BY MOUTH DAILY AS NEEDED FOR ALLERGIES., Disp: 90 tablet, Rfl: 2   montelukast (SINGULAIR) 10 MG tablet, TAKE 1 TABLET BY MOUTH EVERYDAY AT BEDTIME, Disp: 90 tablet, Rfl: 2   Nutritional Supplements (FRUIT & VEGETABLE DAILY PO), Take by mouth daily at 6 (six) AM., Disp: , Rfl:    ondansetron (ZOFRAN) 4  MG tablet, Take 1 tablet (4 mg total) by mouth every 8 (eight) hours as needed for nausea or vomiting., Disp: 20 tablet, Rfl: 0   oxyCODONE-acetaminophen (PERCOCET) 5-325 MG tablet, Take 1 tablet by mouth every 4 (four) hours as needed for severe pain., Disp: 30 tablet, Rfl: 0   pantoprazole (PROTONIX) 40 MG tablet, TAKE 1 TABLET (40 MG TOTAL) BY MOUTH 2 (TWO) TIMES DAILY BEFORE A MEAL. ON AN EMPTY STOMACH, Disp: 180 tablet, Rfl: 0   sodium chloride (OCEAN) 0.65 % SOLN nasal spray, Place 1 spray into both nostrils as needed for congestion., Disp: , Rfl:    triamcinolone (NASACORT) 55 MCG/ACT AERO nasal inhaler, Place 2 sprays daily into the nose. , Disp: , Rfl:   Observations/Objective: Patient is well-developed, well-nourished in no acute distress.  Resting comfortably  at home.  Head is normocephalic, atraumatic.  No labored breathing.  Speech is clear and coherent with logical content.  Patient is alert and oriented at baseline.    Assessment and Plan: 1. Acute bacterial sinusitis  Increase fluids, may continue sudafed and allergy meds, UC if sx persist or worsen.   Follow Up Instructions: I discussed the assessment and treatment plan with the patient. The patient was provided an opportunity to ask questions and all were answered. The patient agreed with the plan and demonstrated an understanding of the instructions.  A copy of instructions were sent to the patient via MyChart unless otherwise noted below.     The patient was advised to call back or seek an in-person evaluation if the symptoms worsen or if the condition fails to improve as anticipated.  Time:  I spent 10 minutes with the patient via telehealth technology discussing the above problems/concerns.    Georgana Curio, FNP

## 2023-02-04 NOTE — Patient Instructions (Signed)

## 2023-05-26 ENCOUNTER — Encounter: Payer: Self-pay | Admitting: Physician Assistant

## 2023-06-05 ENCOUNTER — Encounter: Payer: Self-pay | Admitting: Physician Assistant

## 2023-06-05 ENCOUNTER — Ambulatory Visit (INDEPENDENT_AMBULATORY_CARE_PROVIDER_SITE_OTHER): Payer: BC Managed Care – PPO | Admitting: Physician Assistant

## 2023-06-05 VITALS — BP 138/72 | HR 65 | Resp 16 | Ht 68.0 in | Wt 210.0 lb

## 2023-06-05 DIAGNOSIS — E782 Mixed hyperlipidemia: Secondary | ICD-10-CM

## 2023-06-05 DIAGNOSIS — Z0000 Encounter for general adult medical examination without abnormal findings: Secondary | ICD-10-CM

## 2023-06-05 DIAGNOSIS — Z23 Encounter for immunization: Secondary | ICD-10-CM | POA: Diagnosis not present

## 2023-06-05 NOTE — Progress Notes (Signed)
Annual Physical Exam   Name: Don Patterson   MRN: [id number]    DOB: [birth year]   Date:06/05/2023  Today's Provider: Jacquelin Hawking, MHS, PA-C Introduced myself to the patient as a PA-C and provided education on APPs in clinical practice.         Subjective  Chief Complaint  Chief Complaint  Patient presents with   Annual Exam    HPI  Patient presents for annual CPE .   Diet: thinks his diet could be improved with including more fruits and vegetables  Exercise: walking and doing yoga- tries to walk for at least 30 minutes, 3 times per week, does yoga once per week and tries to stay active with gardening and walking on the weekends  Sleep: "pretty good, since I had the nasal surgery at the beginning of the year, it's been better" he is using Melatonin gummies to help with sleep. Getting about 8 hours per night, feels well rested in the AM  Mood: "pretty good"   IPSS Questionnaire (AUA-7): Over the past month.   1)  How often have you had a sensation of not emptying your bladder completely after you finish urinating?  1 - Less than 1 time in 5  2)  How often have you had to urinate again less than two hours after you finished urinating? 1 - Less than 1 time in 5  3)  How often have you found you stopped and started again several times when you urinated?  0 - Not at all  4) How difficult have you found it to postpone urination?  0 - Not at all  5) How often have you had a weak urinary stream?  0 - Not at all  6) How often have you had to push or strain to begin urination?  0 - Not at all  7) How many times did you most typically get up to urinate from the time you went to bed until the time you got up in the morning?  0 - None  Total score:  0-7 mildly symptomatic   8-19 moderately symptomatic   20-35 severely symptomatic       Depression: phq 9 is negative    06/05/2023    8:36 AM 06/14/2022    1:52 PM 05/30/2022    8:09 AM 05/25/2021    8:18 AM 01/08/2021     9:47 AM  Depression screen PHQ 2/9  Decreased Interest 0 0 0 0 0  Down, Depressed, Hopeless 0 0 1 0 0  PHQ - 2 Score 0 0 1 0 0  Altered sleeping 0 0 1 0 0  Tired, decreased energy 0 0 1 0 0  Change in appetite 0 0 0 0 0  Feeling bad or failure about yourself  0 0 0 0 0  Trouble concentrating 0 0 0 0 0  Moving slowly or fidgety/restless 0 0 0 0 0  Suicidal thoughts 0 0 0 0 0  PHQ-9 Score 0 0 3 0 0  Difficult doing work/chores  Not difficult at all Somewhat difficult Not difficult at all Not difficult at all    Hypertension:  BP Readings from Last 3 Encounters:  06/05/23 138/72  09/21/22 (!) 163/88  08/10/22 132/78    Obesity: Wt Readings from Last 3 Encounters:  06/05/23 210 lb (95.3 kg)  09/21/22 200 lb (90.7 kg)  08/10/22 206 lb 2 oz (93.5 kg)   BMI Readings from Last 3 Encounters:  06/05/23 31.93 kg/m  09/21/22 28.70 kg/m  08/10/22 31.81 kg/m     Lipids:  Lab Results  Component Value Date   CHOL 180 05/30/2022   CHOL 178 05/25/2021   CHOL 164 05/20/2020   Lab Results  Component Value Date   HDL 48 05/30/2022   HDL 55 05/25/2021   HDL 38 (L) 05/20/2020   Lab Results  Component Value Date   LDLCALC 107 (H) 05/30/2022   LDLCALC 103 (H) 05/25/2021   LDLCALC 99 05/20/2020   Lab Results  Component Value Date   TRIG 130 05/30/2022   TRIG 104 05/25/2021   TRIG 175 (H) 05/20/2020   Lab Results  Component Value Date   CHOLHDL 3.8 05/30/2022   CHOLHDL 3.2 05/25/2021   CHOLHDL 4.3 05/20/2020   No results found for: "LDLDIRECT" Glucose:  Glucose, Bld  Date Value Ref Range Status  05/30/2022 95 65 - 99 mg/dL Final    Comment:    .            Fasting reference interval .   01/08/2021 96 65 - 99 mg/dL Final    Comment:    .            Fasting reference interval .   05/20/2020 95 65 - 99 mg/dL Final    Comment:    .            Fasting reference interval .     Flowsheet Row Office Visit from 06/05/2023 in [facility]  AUDIT-C Score 1         Single STD testing and prevention (HIV/chl/gon/syphilis):  no, declines screening today He is not sexually active currently  Skin cancer: Discussed monitoring for atypical lesions Prostate cancer screening:  ordered No results found for: "PSA"   Lung cancer:  Low Dose CT Chest recommended if Age 14-80 years, 30 pack-year currently smoking OR have quit w/in 15years. Patient  not applicable AAA: The USPSTF recommends one-time screening with ultrasonography in men ages 66 to 75 years who have ever smoked. Patient:  not applicable ECG:  NA  Health Maintenance  Topic Date Due   COVID-19 Vaccine (4 - 2023-24 season) 03/05/2023   Zoster (Shingles) Vaccine (1 of 2) 09/03/2023*   Colon Cancer Screening  04/19/2028   DTaP/Tdap/Td vaccine (3 - Td or Tdap) 05/17/2028   Flu Shot  Completed   Hepatitis C Screening  Completed   HIV Screening  Completed   HPV Vaccine  Aged Out  *Topic was postponed. The date shown is not the original due date.     Advanced Care Planning: A voluntary discussion about advance care planning including the explanation and discussion of advance directives.  Discussed health care proxy and Living will, and the patient was able to identify a health care proxy as no one currently.  Patient does not have a living will in effect at this time.  Patient Active Problem List   Diagnosis Date Noted   Allergic rhinitis 11/20/2019   Overweight (BMI 25.0-29.9) 05/17/2018   Hyperlipidemia 05/18/2016   Migraine aura without headache 01/14/2015   Gastroesophageal reflux disease without esophagitis 01/14/2015    Past Surgical History:  Procedure Laterality Date   COLONOSCOPY WITH PROPOFOL N/A 04/19/2021   Procedure: COLONOSCOPY WITH PROPOFOL;  Surgeon: Toney Reil, MD;  Location: [facility] END[facility]COPY;  Service: Gastroenterology;  Laterality: N/A;   CYST REMOVAL NECK Left 07/04/1997   face   ESOPHAGOGASTRODUODENOSCOPY N/A 04/19/2021    Procedure:  ESOPHAGOGASTRODUODENOSCOPY (EGD);  Surgeon: Toney Reil, MD;  Location: [facility] ENDOSCOPY;  Service: Gastroenterology;  Laterality: N/A;   SEPTOPLASTY Bilateral 09/21/2022   Procedure: SEPTOPLASTY;  Surgeon: Bud Face, MD;  Location: [facility];  Service: ENT;  Laterality: Bilateral;   TURBINATE REDUCTION Bilateral 09/21/2022   Procedure: BILATERAL INFERIOR TURBINATE REDUCTION RIGHT MIDDLE TURBINATE REDUCTION;  Surgeon: Bud Face, MD;  Location: [facility];  Service: ENT;  Laterality: Bilateral;   WISDOM TOOTH EXTRACTION      Family History  Problem Relation Age of Onset   Cancer Mother        breast and ovarian   Heart disease Father    Heart disease Paternal Uncle     Social History   Socioeconomic History   Marital status: Single    Spouse name: Not on file   Number of children: Not on file   Years of education: 16   Highest education level: Bachelor's degree (e.g., BA, AB, BS)  Occupational History   Not on file  Tobacco Use   Smoking status: Never   Smokeless tobacco: Never  Vaping Use   Vaping status: Never Used  Substance and Sexual Activity   Alcohol use: Yes    Alcohol/week: 0.0 standard drinks of alcohol    Comment: rarely   Drug use: No   Sexual activity: Not Currently  Other Topics Concern   Not on file  Social History Narrative   Not on file   Social Determinants of Health   Financial Resource Strain: Low Risk  (05/29/2023)   Overall Financial Resource Strain (CARDIA)    Difficulty of Paying Living Expenses: Not very hard  Food Insecurity: No Food Insecurity (05/29/2023)   Hunger Vital Sign    Worried About Running Out of Food in the Last Year: Never true    Ran Out of Food in the Last Year: Never true  Transportation Needs: No Transportation Needs (05/29/2023)   PRAPARE - Administrator, Civil Service (Medical): No    Lack of Transportation (Non-Medical): No  Physical Activity:  Insufficiently Active (05/29/2023)   Exercise Vital Sign    Days of Exercise per Week: 4 days    Minutes of Exercise per Session: 30 min  Stress: No Stress Concern Present (05/29/2023)   [employer] of Occupational Health - Occupational Stress Questionnaire    Feeling of Stress : Only a little  Social Connections: Moderately Integrated (05/29/2023)   Social Connection and Isolation Panel [NHANES]    Frequency of Communication with Friends and Family: Once a week    Frequency of Social Gatherings with Friends and Family: Twice a week    Attends Religious Services: More than 4 times per year    Active Member of [employer] or Organizations: Yes    Attends Banker Meetings: More than 4 times per year    Marital Status: Never married  Intimate Partner Violence: Not At Risk (06/05/2023)   Humiliation, Afraid, Rape, and Kick questionnaire    Fear of Current or Ex-Partner: No    Emotionally Abused: No    Physically Abused: No    Sexually Abused: No     Current Outpatient Medications:    atorvastatin (LIPITOR) 20 MG tablet, TAKE 1 TABLET BY MOUTH EVERYDAY AT BEDTIME, Disp: 90 tablet, Rfl: 3   azelastine (ASTELIN) 0.1 % nasal spray, Place 1 spray into both nostrils 2 (two) times daily. Use in each nostril as directed, Disp: 30 mL, Rfl: 12   Flaxseed, Linseed, (  FLAX SEEDS PO), Take by mouth daily., Disp: , Rfl:    GuaiFENesin (MUCINEX PO), Take as needed by mouth., Disp: , Rfl:    levocetirizine (XYZAL) 5 MG tablet, TAKE 1 TABLET BY MOUTH DAILY AS NEEDED FOR ALLERGIES., Disp: 90 tablet, Rfl: 2   montelukast (SINGULAIR) 10 MG tablet, TAKE 1 TABLET BY MOUTH EVERYDAY AT BEDTIME, Disp: 90 tablet, Rfl: 2   Nutritional Supplements (FRUIT & VEGETABLE DAILY PO), Take by mouth daily at 6 (six) AM., Disp: , Rfl:    sodium chloride (OCEAN) 0.65 % SOLN nasal spray, Place 1 spray into both nostrils as needed for congestion., Disp: , Rfl:    triamcinolone (NASACORT) 55 MCG/ACT AERO nasal  inhaler, Place 2 sprays daily into the nose. , Disp: , Rfl:   Allergies  Allergen Reactions   Cat Hair Extract    Dust Mite Extract Other (See Comments)   Tree Extract      Review of Systems  Constitutional:  Negative for chills, fever, malaise/fatigue and weight loss.  HENT:  Negative for hearing loss, nosebleeds, sore throat and tinnitus.   Eyes:  Negative for blurred vision, double vision and photophobia.  Respiratory:  Negative for cough, shortness of breath and wheezing.   Cardiovascular:  Negative for chest pain, palpitations and leg swelling.  Gastrointestinal:  Positive for heartburn. Negative for blood in stool, constipation, diarrhea, nausea and vomiting.  Genitourinary:  Negative for dysuria and frequency.  Musculoskeletal:  Negative for falls, joint pain and myalgias.  Skin:  Negative for itching and rash.  Neurological:  Negative for dizziness, tingling, tremors, loss of consciousness, weakness and headaches.  Psychiatric/Behavioral:  Negative for depression, memory loss, substance abuse and suicidal ideas. The patient is not nervous/anxious and does not have insomnia.      Objective  Vitals:   06/05/23 0836  BP: 138/72  Pulse: 65  Resp: 16  SpO2: 99%  Weight: 210 lb (95.3 kg)  Height: 5\' 8"  (1.727 m)    Body mass index is 31.93 kg/m.  Physical Exam Vitals reviewed.  Constitutional:      General: He is awake.     Appearance: Normal appearance. He is well-developed and well-groomed.  HENT:     Head: Normocephalic and atraumatic.     Right Ear: Hearing, tympanic membrane and ear canal normal.     Left Ear: Hearing, tympanic membrane and ear canal normal.     Nose: Nose normal.     Mouth/Throat:     Lips: Pink.     Mouth: Mucous membranes are moist. No lacerations or oral lesions.     Pharynx: Oropharynx is clear. Uvula midline. No pharyngeal swelling, oropharyngeal exudate or posterior oropharyngeal erythema.  Eyes:     General: Lids are normal. Gaze  aligned appropriately.     Extraocular Movements: Extraocular movements intact.     Right eye: Normal extraocular motion and no nystagmus.     Left eye: Normal extraocular motion and no nystagmus.     Conjunctiva/sclera: Conjunctivae normal.     Pupils: Pupils are equal, round, and reactive to light.  Neck:     Thyroid: No thyroid mass, thyromegaly or thyroid tenderness.     Trachea: Phonation normal.  Cardiovascular:     Rate and Rhythm: Normal rate and regular rhythm.     Pulses: Normal pulses.          Radial pulses are 2+ on the right side and 2+ on the left side.     Heart sounds:  Normal heart sounds. No murmur heard.    No friction rub. No gallop.  Pulmonary:     Effort: Pulmonary effort is normal.     Breath sounds: Normal breath sounds. No decreased air movement. No decreased breath sounds, wheezing, rhonchi or rales.  Abdominal:     General: Abdomen is flat. Bowel sounds are normal.     Palpations: Abdomen is soft.     Tenderness: There is no abdominal tenderness.  Musculoskeletal:     Cervical back: Normal range of motion and neck supple.     Right lower leg: No edema.     Left lower leg: No edema.  Lymphadenopathy:     Head:     Right side of head: No submental, submandibular or preauricular adenopathy.     Left side of head: No submental, submandibular or preauricular adenopathy.     Cervical: No cervical adenopathy.     Right cervical: No superficial or posterior cervical adenopathy.    Left cervical: No superficial or posterior cervical adenopathy.     Upper Body:     Right upper body: No supraclavicular adenopathy.     Left upper body: No supraclavicular adenopathy.  Skin:    General: Skin is warm and dry.  Neurological:     General: No focal deficit present.     Mental Status: He is alert and oriented to person, place, and time. Mental status is at baseline.     GCS: GCS eye subscore is 4. GCS verbal subscore is 5. GCS motor subscore is 6.     Cranial Nerves:  No cranial nerve deficit, dysarthria or facial asymmetry.     Motor: No weakness, tremor, atrophy or abnormal muscle tone.     Gait: Gait is intact.     Deep Tendon Reflexes:     Reflex Scores:      Patellar reflexes are 2+ on the right side and 2+ on the left side. Psychiatric:        Attention and Perception: Attention and perception normal.        Mood and Affect: Mood and affect normal.        Speech: Speech normal.        Behavior: Behavior normal. Behavior is cooperative.        Thought Content: Thought content normal.        Cognition and Memory: Cognition normal.        Judgment: Judgment normal.      No results found for this or any previous visit (from the past 2160 hour(s)).   Fall Risk:    06/05/2023    8:36 AM 06/14/2022    1:51 PM 05/30/2022    8:09 AM 05/25/2021    8:18 AM 01/08/2021    9:47 AM  Fall Risk   Falls in the past year? 0 0 0 0 0  Number falls in past yr: 0 0 0 0 0  Injury with Fall? 0 0 0 0 0  Risk for fall due to : No Fall Risks No Fall Risks No Fall Risks    Follow up Falls prevention discussed Falls prevention discussed;Education provided;Falls evaluation completed Falls prevention discussed;Education provided;Falls evaluation completed       Functional Status Survey: Is the patient deaf or have difficulty hearing?: No Does the patient have difficulty seeing, even when wearing glasses/contacts?: No Does the patient have difficulty concentrating, remembering, or making decisions?: No Does the patient have difficulty walking or climbing stairs?: No Does the patient  have difficulty dressing or bathing?: No Does the patient have difficulty doing errands alone such as visiting a doctor's office or shopping?: No    Assessment & Plan  Problem List Items Addressed This Visit       Other   Hyperlipidemia   Relevant Orders   Lipid panel   Other Visit Diagnoses     Annual physical exam    -  Primary   Relevant Orders   TSH   Hemoglobin  A1c   Lipid panel   CBC with Differential/Platelet   COMPLETE METABOLIC PANEL WITH GFR   PSA   Need for immunization against influenza       Relevant Orders   Flu vaccine trivalent PF, 6mos and older(Flulaval,Afluria,Fluarix,Fluzone) (Completed)      -Prostate cancer screening and PSA options (with potential risks and benefits of testing vs not testing) were discussed along with recent recs/guidelines. -USPSTF grade A and B recommendations reviewed with patient; age-appropriate recommendations, preventive care, screening tests, etc discussed and encouraged; healthy living encouraged; see AVS for patient education given to patient -Discussed importance of 150 minutes of physical activity weekly, eat two servings of fish weekly, eat one serving of tree nuts ( cashews, pistachios, pecans, almonds.Marland Kitchen) every other day, eat 6 servings of fruit/vegetables daily and drink plenty of water and avoid sweet beverages.  -Reviewed Health Maintenance: yes   Return in about 6 months (around 12/04/2023) for HLD, GERD.   I, Genie Wenke E Adelene Polivka, PA-C, have reviewed all documentation for this visit. The documentation on 06/05/23 for the exam, diagnosis, procedures, and orders are all accurate and complete.   Jacquelin Hawking, MHS, PA-C [facility]

## 2023-06-05 NOTE — Patient Instructions (Signed)
It was nice to meet you and I appreciate the opportunity to be involved in your care If you were satisfied with the care you received from me, I would greatly appreciate you saying so in the after-visit survey that is sent out following our visit.   

## 2023-06-06 ENCOUNTER — Encounter: Payer: Self-pay | Admitting: Physician Assistant

## 2023-06-06 LAB — CBC WITH DIFFERENTIAL/PLATELET
Absolute Lymphocytes: 1573 {cells}/uL (ref 850–3900)
Absolute Monocytes: 462 {cells}/uL (ref 200–950)
Basophils Absolute: 72 {cells}/uL (ref 0–200)
Basophils Relative: 1.3 %
Eosinophils Absolute: 110 {cells}/uL (ref 15–500)
Eosinophils Relative: 2 %
HCT: 49 % (ref 38.5–50.0)
Hemoglobin: 16.4 g/dL (ref 13.2–17.1)
MCH: 30 pg (ref 27.0–33.0)
MCHC: 33.5 g/dL (ref 32.0–36.0)
MCV: 89.6 fL (ref 80.0–100.0)
MPV: 10.9 fL (ref 7.5–12.5)
Monocytes Relative: 8.4 %
Neutro Abs: 3284 {cells}/uL (ref 1500–7800)
Neutrophils Relative %: 59.7 %
Platelets: 233 10*3/uL (ref 140–400)
RBC: 5.47 10*6/uL (ref 4.20–5.80)
RDW: 12.5 % (ref 11.0–15.0)
Total Lymphocyte: 28.6 %
WBC: 5.5 10*3/uL (ref 3.8–10.8)

## 2023-06-06 LAB — COMPLETE METABOLIC PANEL WITH GFR
AG Ratio: 1.9 (calc) (ref 1.0–2.5)
ALT: 46 U/L (ref 9–46)
AST: 27 U/L (ref 10–35)
Albumin: 4.8 g/dL (ref 3.6–5.1)
Alkaline phosphatase (APISO): 79 U/L (ref 35–144)
BUN: 10 mg/dL (ref 7–25)
CO2: 27 mmol/L (ref 20–32)
Calcium: 10 mg/dL (ref 8.6–10.3)
Chloride: 102 mmol/L (ref 98–110)
Creat: 1.24 mg/dL (ref 0.70–1.30)
Globulin: 2.5 g/dL (ref 1.9–3.7)
Glucose, Bld: 101 mg/dL — ABNORMAL HIGH (ref 65–99)
Potassium: 4.2 mmol/L (ref 3.5–5.3)
Sodium: 140 mmol/L (ref 135–146)
Total Bilirubin: 0.7 mg/dL (ref 0.2–1.2)
Total Protein: 7.3 g/dL (ref 6.1–8.1)
eGFR: 71 mL/min/{1.73_m2} (ref >=60)

## 2023-06-06 LAB — HEMOGLOBIN A1C
Hgb A1c MFr Bld: 5.6 %{Hb} (ref <5.7)
Mean Plasma Glucose: 114 mg/dL
eAG (mmol/L): 6.3 mmol/L

## 2023-06-06 LAB — LIPID PANEL
Cholesterol: 192 mg/dL (ref <200)
HDL: 42 mg/dL (ref >=40)
LDL Cholesterol (Calc): 119 mg/dL — ABNORMAL HIGH
Non-HDL Cholesterol (Calc): 150 mg/dL — ABNORMAL HIGH (ref <130)
Total CHOL/HDL Ratio: 4.6 (calc) (ref <5.0)
Triglycerides: 194 mg/dL — ABNORMAL HIGH (ref <150)

## 2023-06-06 LAB — TSH: TSH: 1.8 m[IU]/L (ref 0.40–4.50)

## 2023-06-06 LAB — PSA: PSA: 0.51 ng/mL (ref <=4.00)

## 2023-06-08 NOTE — Progress Notes (Signed)
Your labs are back Your electrolytes, liver and kidney function were overall normal at this time Your thyroid testing was normal Your A1c was 5.6% which is normal Your CBC was normal- no signs of anemia  Your prostate testing was normal  Your cholesterol is elevated and appears to be increased from previous labs.  I recommend a trial of diet and exercise to see if we can control this without starting medication.  This would include reducing saturated fats in your daily diet and increasing "good" fats such as those from olive oil, tree nuts, avocado, etc.  I also recommend increasing your weekly exercise to include at least 150 minutes of moderate intensity physical activity.  If these measures do not improve your cholesterol over the next 6 months I recommend starting a medication to manage your cholesterol

## 2023-08-05 ENCOUNTER — Other Ambulatory Visit: Payer: Self-pay | Admitting: Family Medicine

## 2023-08-05 DIAGNOSIS — J309 Allergic rhinitis, unspecified: Secondary | ICD-10-CM

## 2023-08-05 DIAGNOSIS — E785 Hyperlipidemia, unspecified: Secondary | ICD-10-CM

## 2023-08-07 NOTE — Telephone Encounter (Signed)
Labs in date  Requested Prescriptions  Pending Prescriptions Disp Refills   atorvastatin (LIPITOR) 20 MG tablet [Pharmacy Med Name: ATORVASTATIN 20 MG TABLET] 90 tablet 1    Sig: TAKE 1 TABLET BY MOUTH EVERYDAY AT BEDTIME     Cardiovascular:  Antilipid - Statins Failed - 08/07/2023  1:02 PM      Failed - Lipid Panel in normal range within the last 12 months    Cholesterol, Total  Date Value Ref Range Status  05/19/2015 285 (H) 100 - 199 mg/dL Final   Cholesterol  Date Value Ref Range Status  06/05/2023 192 <200 mg/dL Final   LDL Cholesterol (Calc)  Date Value Ref Range Status  06/05/2023 119 (H) mg/dL (calc) Final    Comment:    Reference range: <100 . Desirable range <100 mg/dL for primary prevention;   <70 mg/dL for patients with CHD or diabetic patients  with > or = 2 CHD risk factors. Marland Kitchen LDL-C is now calculated using the Martin-Hopkins  calculation, which is a validated novel method providing  better accuracy than the Friedewald equation in the  estimation of LDL-C.  Horald Pollen et al. Lenox Ahr. 3086;578(46): 2061-2068  (http://education.[facility].com/faq/FAQ164)    HDL  Date Value Ref Range Status  06/05/2023 42 > OR = 40 mg/dL Final  96/29/5284 31 (L) >39 mg/dL Final    Comment:    According to ATP-III Guidelines, HDL-C >59 mg/dL is considered a negative risk factor for CHD.    Triglycerides  Date Value Ref Range Status  06/05/2023 194 (H) <150 mg/dL Final         Passed - Patient is not pregnant      Passed - Valid encounter within last 12 months    Recent Outpatient Visits           2 months ago Annual physical exam   [facility] Mecum, Oswaldo Conroy, PA-C   1 year ago Recurrent rhinosinusitis   [facility] Danelle Berry, PA-C   1 year ago Annual physical exam   [facility] Danelle Berry, PA-C   2 years ago Routine general medical examination at a health care facility   [facility] Caro Laroche, DO   2 years ago Abdominal pain, unspecified abdominal location   [facility] Danelle Berry, PA-C               montelukast (SINGULAIR) 10 MG tablet [Pharmacy Med Name: MONTELUKAST SOD 10 MG TABLET] 90 tablet 2    Sig: TAKE 1 TABLET BY MOUTH EVERYDAY AT BEDTIME     Pulmonology:  Leukotriene Inhibitors Passed - 08/07/2023  1:02 PM      Passed - Valid encounter within last 12 months    Recent Outpatient Visits           2 months ago Annual physical exam   [facility] Mecum, Oswaldo Conroy, PA-C   1 year ago Recurrent rhinosinusitis   [facility] Danelle Berry, PA-C   1 year ago Annual physical exam   [facility] Danelle Berry, PA-C   2 years ago Routine general medical examination at a health care facility   [facility] Caro Laroche, DO   2 years ago Abdominal pain, unspecified abdominal location   [facility] Danelle Berry, [place]  levocetirizine (XYZAL) 5 MG tablet [Pharmacy Med Name: LEVOCETIRIZINE 5 MG TABLET] 90 tablet 2    Sig: TAKE 1 TABLET BY MOUTH DAILY AS NEEDED FOR ALLERGIES.     Ear, Nose, and Throat:  Antihistamines - levocetirizine dihydrochloride Passed - 08/07/2023  1:02 PM      Passed - Cr in normal range and within 360 days    Creat  Date Value Ref Range Status  06/05/2023 1.24 0.70 - 1.30 mg/dL Final         Passed - eGFR is 10 or above and within 360 days    GFR, Est African American  Date Value Ref Range Status  01/08/2021 94 > OR = 60 mL/min/1.52m2 Final   GFR, Est Non African American  Date Value Ref Range Status  01/08/2021 81 > OR = 60 mL/min/1.31m2 Final   eGFR  Date Value Ref Range Status  06/05/2023 71 > OR = 60 mL/min/1.19m2 Final         Passed - Valid encounter within last 12 months    Recent Outpatient Visits            2 months ago Annual physical exam   [facility] Mecum, Oswaldo Conroy, PA-C   1 year ago Recurrent rhinosinusitis   [facility] Danelle Berry, PA-C   1 year ago Annual physical exam   [facility] Danelle Berry, PA-C   2 years ago Routine general medical examination at a health care facility   [facility] Caro Laroche, DO   2 years ago Abdominal pain, unspecified abdominal location   [facility] Danelle Berry, [place]

## 2023-10-30 ENCOUNTER — Ambulatory Visit: Admitting: Family Medicine

## 2023-10-30 ENCOUNTER — Encounter: Payer: Self-pay | Admitting: Family Medicine

## 2023-10-30 VITALS — BP 152/68 | HR 80 | Resp 16 | Ht 68.0 in | Wt 211.0 lb

## 2023-10-30 DIAGNOSIS — I10 Essential (primary) hypertension: Secondary | ICD-10-CM

## 2023-10-30 DIAGNOSIS — Z09 Encounter for follow-up examination after completed treatment for conditions other than malignant neoplasm: Secondary | ICD-10-CM | POA: Diagnosis not present

## 2023-10-30 NOTE — Progress Notes (Signed)
 Name: Don Patterson   MRN: [id number]    DOB: 19-Mar-1971   Date:10/30/2023       Progress Note  Chief Complaint  Patient presents with   Hospitalization Follow-up   Hypertension    Was given rx for Amlodipine 5mg , started taking daily last Wed.     Subjective:   Don Patterson is a 53 y.o. male, presents to clinic for routine follow up on chronic conditions  Here for f/up on HTN urgency, wasn't feeling well and BP at work for SBP >200 and he went to [facility] ER There he was checked out and found to have no emergent findings, he was tx with antibiotics and started amlodipine   HTN started on amlodipine with home BP monitoring showing some improvement  BP Readings from Last 3 Encounters:  10/30/23 (!) 152/68  06/05/23 138/72  09/21/22 (!) 163/88      Current Outpatient Medications:    amLODipine (NORVASC) 5 MG tablet, Take 5 mg by mouth daily., Disp: , Rfl:    atorvastatin  (LIPITOR) 20 MG tablet, TAKE 1 TABLET BY MOUTH EVERYDAY AT BEDTIME, Disp: 90 tablet, Rfl: 1   azelastine  (ASTELIN ) 0.1 % nasal spray, Place 1 spray into both nostrils 2 (two) times daily. Use in each nostril as directed, Disp: 30 mL, Rfl: 12   Flaxseed, Linseed, (FLAX SEEDS PO), Take by mouth daily., Disp: , Rfl:    GuaiFENesin (MUCINEX PO), Take as needed by mouth., Disp: , Rfl:    levocetirizine (XYZAL ) 5 MG tablet, TAKE 1 TABLET BY MOUTH DAILY AS NEEDED FOR ALLERGIES., Disp: 90 tablet, Rfl: 2   montelukast  (SINGULAIR ) 10 MG tablet, TAKE 1 TABLET BY MOUTH EVERYDAY AT BEDTIME, Disp: 90 tablet, Rfl: 2   Nutritional Supplements (FRUIT & VEGETABLE DAILY PO), Take by mouth daily at 6 (six) AM., Disp: , Rfl:    sodium chloride  (OCEAN) 0.65 % SOLN nasal spray, Place 1 spray into both nostrils as needed for congestion., Disp: , Rfl:    triamcinolone (NASACORT) 55 MCG/ACT AERO nasal inhaler, Place 2 sprays daily into the nose. , Disp: , Rfl:   Patient Active Problem List   Diagnosis Date Noted   Allergic  rhinitis 11/20/2019   Overweight (BMI 25.0-29.9) 05/17/2018   Hyperlipidemia 05/18/2016   Migraine aura without headache 01/14/2015   Gastroesophageal reflux disease without esophagitis 01/14/2015    Past Surgical History:  Procedure Laterality Date   COLONOSCOPY WITH PROPOFOL  N/A 04/19/2021   Procedure: COLONOSCOPY WITH PROPOFOL ;  Surgeon: Selena Daily, MD;  Location: [facility] ENDOSCOPY;  Service: Gastroenterology;  Laterality: N/A;   CYST REMOVAL NECK Left 07/04/1997   face   ESOPHAGOGASTRODUODENOSCOPY N/A 04/19/2021   Procedure: ESOPHAGOGASTRODUODENOSCOPY (EGD);  Surgeon: Selena Daily, MD;  Location: [facility] ENDOSCOPY;  Service: Gastroenterology;  Laterality: N/A;   SEPTOPLASTY Bilateral 09/21/2022   Procedure: SEPTOPLASTY;  Surgeon: Rogers Clayman, MD;  Location: [facility];  Service: ENT;  Laterality: Bilateral;   TURBINATE REDUCTION Bilateral 09/21/2022   Procedure: BILATERAL INFERIOR TURBINATE REDUCTION RIGHT MIDDLE TURBINATE REDUCTION;  Surgeon: Rogers Clayman, MD;  Location: [facility];  Service: ENT;  Laterality: Bilateral;   WISDOM TOOTH EXTRACTION      Family History  Problem Relation Age of Onset   Cancer Mother        breast and ovarian   Heart disease Father    Heart disease Paternal Uncle     Social History   Tobacco Use   Smoking status: Never   Smokeless  tobacco: Never  Vaping Use   Vaping status: Never Used  Substance Use Topics   Alcohol use: Yes    Alcohol/week: 0.0 standard drinks of alcohol    Comment: rarely   Drug use: No     Allergies  Allergen Reactions   Cat Dander    Dust Mite Extract Other (See Comments)   Tree Extract     Health Maintenance  Topic Date Due   COVID-19 Vaccine (4 - 2024-25 season) 03/05/2023   Zoster Vaccines- Shingrix (1 of 2) 01/29/2024 (Originally 05/15/2023)   INFLUENZA VACCINE  02/02/2024   Colonoscopy  04/19/2028   DTaP/Tdap/Td (3 - Td or Tdap) 05/17/2028   Hepatitis C Screening   Completed   HIV Screening  Completed   HPV VACCINES  Aged Out   Meningococcal B Vaccine  Aged Out    Chart Review Today: I personally reviewed active problem list, medication list, allergies, family history, social history, health maintenance, notes from last encounter, lab results, imaging with the patient/caregiver today.   Review of Systems  Constitutional: Negative.   HENT: Negative.    Eyes: Negative.   Respiratory: Negative.    Cardiovascular: Negative.   Gastrointestinal: Negative.   Endocrine: Negative.   Genitourinary: Negative.   Musculoskeletal: Negative.   Skin: Negative.   Allergic/Immunologic: Negative.   Neurological: Negative.   Hematological: Negative.   Psychiatric/Behavioral: Negative.    All other systems reviewed and are negative.    Objective:   Vitals:   10/30/23 1356 10/30/23 1413  BP: (!) 162/70 (!) 152/68  Pulse: 80   Resp: 16   SpO2: 99%   Weight: 211 lb (95.7 kg)   Height: 5\' 8"  (1.727 m)     Body mass index is 32.08 kg/m.  Physical Exam Vitals and nursing note reviewed.  Constitutional:      General: He is not in acute distress.    Appearance: He is well-developed. He is not ill-appearing, toxic-appearing or diaphoretic.  HENT:     Head: Normocephalic and atraumatic.     Nose: Nose normal.  Eyes:     General:        Right eye: No discharge.        Left eye: No discharge.     Conjunctiva/sclera: Conjunctivae normal.  Neck:     Trachea: No tracheal deviation.  Cardiovascular:     Rate and Rhythm: Normal rate and regular rhythm.     Pulses: Normal pulses.     Heart sounds: Normal heart sounds. No murmur heard.    No friction rub. No gallop.  Pulmonary:     Effort: Pulmonary effort is normal. No respiratory distress.     Breath sounds: Normal breath sounds. No stridor. No wheezing, rhonchi or rales.  Musculoskeletal:        General: Normal range of motion.  Skin:    General: Skin is warm and dry.     Capillary Refill:  Capillary refill takes less than 2 seconds.     Findings: No rash.  Neurological:     Mental Status: He is alert.     Motor: No abnormal muscle tone.     Coordination: Coordination normal.  Psychiatric:        Behavior: Behavior normal.      Functional Status Survey:   Results for orders placed or performed in visit on 06/05/23  TSH   Collection Time: 06/05/23  9:24 AM  Result Value Ref Range   TSH 1.80 0.40 - 4.50 mIU/L  Hemoglobin A1c   Collection Time: 06/05/23  9:24 AM  Result Value Ref Range   Hgb A1c MFr Bld 5.6 <5.7 % of total Hgb   Mean Plasma Glucose 114 mg/dL   eAG (mmol/L) 6.3 mmol/L  Lipid panel   Collection Time: 06/05/23  9:24 AM  Result Value Ref Range   Cholesterol 192 <200 mg/dL   HDL 42 > OR = 40 mg/dL   Triglycerides 914 (H) <150 mg/dL   LDL Cholesterol (Calc) 119 (H) mg/dL (calc)   Total CHOL/HDL Ratio 4.6 <5.0 (calc)   Non-HDL Cholesterol (Calc) 150 (H) <130 mg/dL (calc)  CBC with Differential/Platelet   Collection Time: 06/05/23  9:24 AM  Result Value Ref Range   WBC 5.5 3.8 - 10.8 Thousand/uL   RBC 5.47 4.20 - 5.80 Million/uL   Hemoglobin 16.4 13.2 - 17.1 g/dL   HCT 78.2 95.6 - 21.3 %   MCV 89.6 80.0 - 100.0 fL   MCH 30.0 27.0 - 33.0 pg   MCHC 33.5 32.0 - 36.0 g/dL   RDW 08.6 57.8 - 46.9 %   Platelets 233 140 - 400 Thousand/uL   MPV 10.9 7.5 - 12.5 fL   Neutro Abs 3,284 1,500 - 7,800 cells/uL   Absolute Lymphocytes 1,573 850 - 3,900 cells/uL   Absolute Monocytes 462 200 - 950 cells/uL   Eosinophils Absolute 110 15 - 500 cells/uL   Basophils Absolute 72 0 - 200 cells/uL   Neutrophils Relative % 59.7 %   Total Lymphocyte 28.6 %   Monocytes Relative 8.4 %   Eosinophils Relative 2.0 %   Basophils Relative 1.3 %  COMPLETE METABOLIC PANEL WITH GFR   Collection Time: 06/05/23  9:24 AM  Result Value Ref Range   Glucose, Bld 101 (H) 65 - 99 mg/dL   BUN 10 7 - 25 mg/dL   Creat 6.29 5.28 - 4.13 mg/dL   eGFR 71 > OR = 60 KG/MWN/0.27O5    BUN/Creatinine Ratio SEE NOTE: 6 - 22 (calc)   Sodium 140 135 - 146 mmol/L   Potassium 4.2 3.5 - 5.3 mmol/L   Chloride 102 98 - 110 mmol/L   CO2 27 20 - 32 mmol/L   Calcium  10.0 8.6 - 10.3 mg/dL   Total Protein 7.3 6.1 - 8.1 g/dL   Albumin 4.8 3.6 - 5.1 g/dL   Globulin 2.5 1.9 - 3.7 g/dL (calc)   AG Ratio 1.9 1.0 - 2.5 (calc)   Total Bilirubin 0.7 0.2 - 1.2 mg/dL   Alkaline phosphatase (APISO) 79 35 - 144 U/L   AST 27 10 - 35 U/L   ALT 46 9 - 46 U/L  PSA   Collection Time: 06/05/23  9:24 AM  Result Value Ref Range   PSA 0.51 < OR = 4.00 ng/mL      Assessment & Plan:   1. Hospital discharge follow-up (Primary) [facility] records, labs and results all reviewed today  2. Hypertension, unspecified type Pt has been on amlodipine for less than 1 week with improvement in his BP Also suspect being ill was also contributing to his sx and elevated BP Pt will continue to take the same dose, work on diet/lifestyle efforts as well as monitor home BP readings and f/up in a few more weeks to see if we need to adjust amlodipine dose BP Readings from Last 3 Encounters:  10/30/23 (!) 152/68  06/05/23 138/72  09/21/22 (!) 163/88       Return for 3 week HTN f/up and med check  (  virtual appt ok with his home readings).   Adeline Hone, PA-C 10/30/23 2:21 PM

## 2023-11-11 ENCOUNTER — Encounter: Payer: Self-pay | Admitting: Family Medicine

## 2023-11-13 ENCOUNTER — Encounter: Payer: Self-pay | Admitting: Family Medicine

## 2023-11-20 ENCOUNTER — Telehealth (INDEPENDENT_AMBULATORY_CARE_PROVIDER_SITE_OTHER): Admitting: Family Medicine

## 2023-11-20 ENCOUNTER — Encounter: Payer: Self-pay | Admitting: Family Medicine

## 2023-11-20 VITALS — BP 152/84 | HR 68

## 2023-11-20 DIAGNOSIS — E785 Hyperlipidemia, unspecified: Secondary | ICD-10-CM

## 2023-11-20 DIAGNOSIS — I10 Essential (primary) hypertension: Secondary | ICD-10-CM | POA: Diagnosis not present

## 2023-11-20 DIAGNOSIS — F419 Anxiety disorder, unspecified: Secondary | ICD-10-CM

## 2023-11-20 MED ORDER — ATORVASTATIN CALCIUM 20 MG PO TABS: ORAL_TABLET | ORAL | 2 refills | Status: AC

## 2023-11-20 MED ORDER — BUSPIRONE HCL 5 MG PO TABS: 5.0000 mg | ORAL_TABLET | Freq: Two times a day (BID) | ORAL | 2 refills | Status: DC | PRN

## 2023-11-20 MED ORDER — AMLODIPINE BESYLATE 5 MG PO TABS: 5.0000 mg | ORAL_TABLET | Freq: Every day | ORAL | 2 refills | Status: AC

## 2023-11-20 MED ORDER — AMLODIPINE BESYLATE 5 MG PO TABS: 5.0000 mg | ORAL_TABLET | Freq: Every day | ORAL | 1 refills | Status: DC

## 2023-11-20 NOTE — Progress Notes (Unsigned)
 Name: Don Patterson   MRN: [id number]    DOB: 10/06/1970   Date:11/20/2023       Progress Note  Subjective:    I connected with  Don Patterson  on 11/20/23 at 10:20 AM EDT by a video enabled telemedicine application and verified that I am speaking with the correct person using two identifiers.  I discussed the limitations of evaluation and management by telemedicine and the availability of in person appointments. The patient expressed understanding and agreed to proceed. Staff also discussed with the patient that there may be a patient responsible charge related to this service. Patient Location: work Advertising account planner Location: [facility] clinic office  Additional Individuals present: none  Chief Complaint  Patient presents with   Hypertension    3 week follow-up    Don Patterson is a 53 y.o. male, presents for virtual visit for routine follow up on the conditions listed above.  BP f/up after ED visit and starting amlodipine   BP checks most days around 120/60  He started to check first thing in the aM before taking meds and BP a little higher 140-150's asx No SE or concerns BP Readings from Last 3 Encounters:  11/20/23 (!) 152/84  10/30/23 (!) 152/68  06/05/23 138/72   He feels like anxiety and stress are worse - he has less buffer on how he reacts to things     05/30/2022    8:25 AM  GAD 7 : Generalized Anxiety Score  Nervous, Anxious, on Edge 2  Control/stop worrying 0  Worry too much - different things 0  Trouble relaxing 0  Restless 0  Easily annoyed or irritable 0  Afraid - awful might happen 0  Total GAD 7 Score 2  Anxiety Difficulty Somewhat difficult      06/05/2023    8:36 AM 06/14/2022    1:52 PM 05/30/2022    8:09 AM 05/25/2021    8:18 AM 01/08/2021    9:47 AM  Depression screen PHQ 2/9  Decreased Interest 0 0 0 0 0  Down, Depressed, Hopeless 0 0 1 0 0  PHQ - 2 Score 0 0 1 0 0  Altered sleeping 0 0 1 0 0  Tired, decreased energy 0 0 1 0 0   Change in appetite 0 0 0 0 0  Feeling bad or failure about yourself  0 0 0 0 0  Trouble concentrating 0 0 0 0 0  Moving slowly or fidgety/restless 0 0 0 0 0  Suicidal thoughts 0 0 0 0 0  PHQ-9 Score 0 0 3 0 0  Difficult doing work/chores  Not difficult at all Somewhat difficult Not difficult at all Not difficult at all       Patient Active Problem List   Diagnosis Date Noted   Allergic rhinitis 11/20/2019   Overweight (BMI 25.0-29.9) 05/17/2018   Hyperlipidemia 05/18/2016   Migraine aura without headache 01/14/2015   Gastroesophageal reflux disease without esophagitis 01/14/2015    Current Outpatient Medications:    amLODipine  (NORVASC ) 5 MG tablet, Take 5 mg by mouth daily., Disp: , Rfl:    atorvastatin  (LIPITOR) 20 MG tablet, TAKE 1 TABLET BY MOUTH EVERYDAY AT BEDTIME, Disp: 90 tablet, Rfl: 1   azelastine  (ASTELIN ) 0.1 % nasal spray, Place 1 spray into both nostrils 2 (two) times daily. Use in each nostril as directed, Disp: 30 mL, Rfl: 12   Flaxseed, Linseed, (FLAX SEEDS PO), Take by mouth daily., Disp: , Rfl:  GuaiFENesin (MUCINEX PO), Take as needed by mouth., Disp: , Rfl:    levocetirizine (XYZAL ) 5 MG tablet, TAKE 1 TABLET BY MOUTH DAILY AS NEEDED FOR ALLERGIES., Disp: 90 tablet, Rfl: 2   montelukast  (SINGULAIR ) 10 MG tablet, TAKE 1 TABLET BY MOUTH EVERYDAY AT BEDTIME, Disp: 90 tablet, Rfl: 2   Nutritional Supplements (FRUIT & VEGETABLE DAILY PO), Take by mouth daily at 6 (six) AM., Disp: , Rfl:    sodium chloride  (OCEAN) 0.65 % SOLN nasal spray, Place 1 spray into both nostrils as needed for congestion., Disp: , Rfl:    triamcinolone (NASACORT) 55 MCG/ACT AERO nasal inhaler, Place 2 sprays daily into the nose. , Disp: , Rfl:  Allergies  Allergen Reactions   Cat Dander    Dust Mite Extract Other (See Comments)   Tree Extract     Past Surgical History:  Procedure Laterality Date   COLONOSCOPY WITH PROPOFOL  N/A 04/19/2021   Procedure: COLONOSCOPY WITH PROPOFOL ;   Surgeon: Selena Daily, MD;  Location: [facility] ENDOSCOPY;  Service: Gastroenterology;  Laterality: N/A;   CYST REMOVAL NECK Left 07/04/1997   face   ESOPHAGOGASTRODUODENOSCOPY N/A 04/19/2021   Procedure: ESOPHAGOGASTRODUODENOSCOPY (EGD);  Surgeon: Selena Daily, MD;  Location: [facility] ENDOSCOPY;  Service: Gastroenterology;  Laterality: N/A;   SEPTOPLASTY Bilateral 09/21/2022   Procedure: SEPTOPLASTY;  Surgeon: Rogers Clayman, MD;  Location: [facility];  Service: ENT;  Laterality: Bilateral;   TURBINATE REDUCTION Bilateral 09/21/2022   Procedure: BILATERAL INFERIOR TURBINATE REDUCTION RIGHT MIDDLE TURBINATE REDUCTION;  Surgeon: Rogers Clayman, MD;  Location: [facility];  Service: ENT;  Laterality: Bilateral;   WISDOM TOOTH EXTRACTION     Family History  Problem Relation Age of Onset   Cancer Mother        breast and ovarian   Heart disease Father    Heart disease Paternal Uncle    Social History   Socioeconomic History   Marital status: Single    Spouse name: Not on file   Number of children: Not on file   Years of education: 16   Highest education level: Bachelor's degree (e.g., BA, AB, BS)  Occupational History   Not on file  Tobacco Use   Smoking status: Never   Smokeless tobacco: Never  Vaping Use   Vaping status: Never Used  Substance and Sexual Activity   Alcohol use: Yes    Alcohol/week: 0.0 standard drinks of alcohol    Comment: rarely   Drug use: No   Sexual activity: Not Currently  Other Topics Concern   Not on file  Social History Narrative   Not on file   Social Drivers of Health   Financial Resource Strain: Low Risk  (05/29/2023)   Overall Financial Resource Strain (CARDIA)    Difficulty of Paying Living Expenses: Not very hard  Food Insecurity: No Food Insecurity (05/29/2023)   Hunger Vital Sign    Worried About Running Out of Food in the Last Year: Never true    Ran Out of Food in the Last Year: Never true  Transportation  Needs: No Transportation Needs (05/29/2023)   PRAPARE - Administrator, Civil Service (Medical): No    Lack of Transportation (Non-Medical): No  Physical Activity: Insufficiently Active (05/29/2023)   Exercise Vital Sign    Days of Exercise per Week: 4 days    Minutes of Exercise per Session: 30 min  Stress: No Stress Concern Present (05/29/2023)   [employer] of Occupational Health - Occupational Stress Questionnaire  Feeling of Stress : Only a little  Social Connections: Moderately Integrated (05/29/2023)   Social Connection and Isolation Panel [NHANES]    Frequency of Communication with Friends and Family: Once a week    Frequency of Social Gatherings with Friends and Family: Twice a week    Attends Religious Services: More than 4 times per year    Active Member of [employer] or Organizations: Yes    Attends Engineer, structural: More than 4 times per year    Marital Status: Never married  Intimate Partner Violence: Not At Risk (10/24/2023)   Received from [facility]   Humiliation, Afraid, Rape, and Kick questionnaire    Fear of Current or Ex-Partner: No    Emotionally Abused: No    Physically Abused: No    Sexually Abused: No    Chart Review Today: I personally reviewed active problem list, medication list, allergies, family history, social history, health maintenance, notes from last encounter, lab results, imaging with the patient/caregiver today.   Review of Systems  Constitutional: Negative.   HENT: Negative.    Eyes: Negative.   Respiratory: Negative.    Cardiovascular: Negative.   Gastrointestinal: Negative.   Endocrine: Negative.   Genitourinary: Negative.   Musculoskeletal: Negative.   Skin: Negative.   Allergic/Immunologic: Negative.   Neurological: Negative.   Hematological: Negative.   Psychiatric/Behavioral: Negative.    All other systems reviewed and are negative.     Objective:    Virtual encounter, vitals limited,  only able to obtain the following Today's Vitals   11/20/23 0947  BP: (!) 152/84  Pulse: 68   There is no height or weight on file to calculate BMI. Nursing Note and Vital Signs reviewed.  Physical Exam Vitals and nursing note reviewed.  Constitutional:      General: He is not in acute distress.    Appearance: Normal appearance. He is ill-appearing. He is not toxic-appearing or diaphoretic.  Pulmonary:     Effort: Pulmonary effort is normal. No respiratory distress.  Neurological:     Mental Status: He is alert.  Psychiatric:        Mood and Affect: Mood normal.        Behavior: Behavior normal.     PE limited by virtual encounter  No results found for this or any previous visit (from the past 72 hours).  PHQ2/9:    06/05/2023    8:36 AM 06/14/2022    1:52 PM 05/30/2022    8:09 AM 05/25/2021    8:18 AM 01/08/2021    9:47 AM  Depression screen PHQ 2/9  Decreased Interest 0 0 0 0 0  Down, Depressed, Hopeless 0 0 1 0 0  PHQ - 2 Score 0 0 1 0 0  Altered sleeping 0 0 1 0 0  Tired, decreased energy 0 0 1 0 0  Change in appetite 0 0 0 0 0  Feeling bad or failure about yourself  0 0 0 0 0  Trouble concentrating 0 0 0 0 0  Moving slowly or fidgety/restless 0 0 0 0 0  Suicidal thoughts 0 0 0 0 0  PHQ-9 Score 0 0 3 0 0  Difficult doing work/chores  Not difficult at all Somewhat difficult Not difficult at all Not difficult at all   PHQ-2/9 Result is reviewed   Fall Risk:    06/05/2023    8:36 AM 06/14/2022    1:51 PM 05/30/2022    8:09 AM 05/25/2021  8:18 AM 01/08/2021    9:47 AM  Fall Risk   Falls in the past year? 0 0 0 0 0  Number falls in past yr: 0 0 0 0 0  Injury with Fall? 0 0 0 0 0  Risk for fall due to : No Fall Risks No Fall Risks No Fall Risks    Follow up Falls prevention discussed Falls prevention discussed;Education provided;Falls evaluation completed Falls prevention discussed;Education provided;Falls evaluation completed       Assessment and Plan:      ICD-10-CM   1. Hypertension, unspecified type  I10 amLODipine  (NORVASC ) 5 MG tablet    DISCONTINUED: amLODipine  (NORVASC ) 5 MG tablet   sending in BP readings, some early morning hours BP seems mildly elevated but most sound at goal, continue same med and dose for now    2. Hyperlipidemia, unspecified hyperlipidemia type  E78.5 atorvastatin  (LIPITOR) 20 MG tablet   med refills sent in    3. Anxiety  F41.9 busPIRone  (BUSPAR ) 5 MG tablet   worsening sx, discussed med and tx options, therapy, self help apps and techniques, he will try buspar       I discussed the assessment and treatment plan with the patient. The patient was provided an opportunity to ask questions and all were answered. The patient agreed with the plan and demonstrated an understanding of the instructions.  The patient was advised to call back or seek an in-person evaluation if the symptoms worsen or if the condition fails to improve as anticipated.  I provided 20+ minutes of non-face-to-face time during this encounter.  Adeline Hone, PA-C 11/20/23 10:41 AM

## 2023-11-20 NOTE — Patient Instructions (Signed)
 There are medications we can try - there are "as needed" meds that are non-sedating that may be a good option.  A common one used one to three times daily

## 2023-11-27 ENCOUNTER — Other Ambulatory Visit: Payer: Self-pay | Admitting: Family Medicine

## 2023-11-27 DIAGNOSIS — F419 Anxiety disorder, unspecified: Secondary | ICD-10-CM

## 2023-11-30 NOTE — Telephone Encounter (Signed)
 Requested medications are due for refill today.  no  Requested medications are on the active medications list.  yes  Last refill. 11/20/2023 #90 2 rf  Future visit scheduled.   no  Notes to clinic.  Pt is requesting a 90 day supply.    Requested Prescriptions  Pending Prescriptions Disp Refills   busPIRone  (BUSPAR ) 5 MG tablet [Pharmacy Med Name: BUSPIRONE  HCL 5 MG TABLET] 540 tablet 1    Sig: Take 1-3 tablets (5-15 mg total) by mouth 2 (two) times daily as needed (anxiety/stress).     Psychiatry: Anxiolytics/Hypnotics - Non-controlled Passed - 11/30/2023 12:39 PM      Passed - Valid encounter within last 12 months    Recent Outpatient Visits           1 week ago Hypertension, unspecified type   [facility] Don Hone, PA-C   1 month ago Hospital discharge follow-up   [facility] Don Hone, PA-C

## 2024-01-29 ENCOUNTER — Encounter: Payer: Self-pay | Admitting: Family Medicine

## 2024-02-16 ENCOUNTER — Encounter: Payer: Self-pay | Admitting: Family Medicine

## 2024-03-01 ENCOUNTER — Other Ambulatory Visit: Payer: Self-pay | Admitting: Family Medicine

## 2024-03-01 DIAGNOSIS — F419 Anxiety disorder, unspecified: Secondary | ICD-10-CM

## 2024-03-04 NOTE — Telephone Encounter (Signed)
 Requested Prescriptions  Pending Prescriptions Disp Refills   busPIRone  (BUSPAR ) 5 MG tablet [Pharmacy Med Name: BUSPIRONE  HCL 5 MG TABLET] 90 tablet 2    Sig: TAKE 1-3 TABLETS (5-15 MG TOTAL) BY MOUTH 2 (TWO) TIMES DAILY AS NEEDED (ANXIETY/STRESS).     Psychiatry: Anxiolytics/Hypnotics - Non-controlled Passed - 03/04/2024  6:07 AM      Passed - Valid encounter within last 12 months    Recent Outpatient Visits           3 months ago Hypertension, unspecified type   [facility] Leavy Mole, PA-C   4 months ago Hospital discharge follow-up   [facility] Leavy Mole, PA-C

## 2024-04-05 ENCOUNTER — Encounter: Payer: Self-pay | Admitting: Family Medicine

## 2024-04-24 ENCOUNTER — Ambulatory Visit: Payer: Self-pay

## 2024-04-24 ENCOUNTER — Encounter: Payer: Self-pay | Admitting: Family Medicine

## 2024-04-24 NOTE — Telephone Encounter (Signed)
 FYI Only or Action Required?: Action required by provider: clinical question for provider and update on patient condition.  Patient was last seen in primary care on 11/20/2023 by Leavy Mole, PA-C.  Called Nurse Triage reporting Anxiety.  Symptoms began a couple of weeks.  Interventions attempted: Prescription medications: Buspar  and Rest, hydration, or home remedies.  Symptoms are: gradually worsening.  Triage Disposition: See PCP Within 2 Weeks  Patient/caregiver understands and will follow disposition?: Yes       Copied from CRM #[id number]. Topic: Clinical - Red Word Triage >> Apr 24, 2024  2:18 PM Yolanda T wrote: Red Word that prompted transfer to Nurse Triage: patient said his anxiety is getting worse and his blood pressure is going up the more he gets anxious. He does not feel as if the medication busPIRone  (BUSPAR ) 5 MG tablet that he is taking helps him as much.       Reason for Disposition  [1] Symptoms of anxiety or panic attack AND [2] is a chronic symptom (recurrent or ongoing AND present > 4 weeks)  Answer Assessment - Initial Assessment Questions Appointment scheduled for 11/7. Patient would like to know if there are any adjustments to his medication that can be done prior to that appointment. Please advise.       1. CONCERN: Did anything happen that prompted you to call today?      Worsening anxiety  2. ANXIETY SYMPTOMS: Can you describe how you (your loved one; patient) have been feeling? (e.g., tense, restless, panicky, anxious, keyed up, overwhelmed, sense of impending doom).      Feeling more anxious recently  3. ONSET: How long have you been feeling this way? (e.g., hours, days, weeks)     Getting worse for the last couple of weeks 4. SEVERITY: How would you rate the level of anxiety? (e.g., 0 - 10; or mild, moderate, severe).     Moderate  5. FUNCTIONAL IMPAIRMENT: How have these feelings affected your ability to do daily activities? Have  you had more difficulty than usual doing your normal daily activities? (e.g., getting better, same, worse; self-care, school, work, interactions)     Some intermittent increased difficulty  6. HISTORY: Have you felt this way before? Have you ever been diagnosed with an anxiety problem in the past? (e.g., generalized anxiety disorder, panic attacks, PTSD). If Yes, ask: How was this problem treated? (e.g., medicines, counseling, etc.)     Yes, history of anxiety  7. RISK OF HARM - SUICIDAL IDEATION: Do you ever have thoughts of hurting or killing yourself? If Yes, ask:  Do you have these feelings now? Do you have a plan on how you would do this?     No 8. TREATMENT:  What has been done so far to treat this anxiety? (e.g., medicines, relaxation strategies). What has helped?     Takes Buspar  but does not believe it's helping  9. THERAPIST: Do you have a counselor or therapist? If Yes, ask: What is their name?     No 10. POTENTIAL TRIGGERS: Do you drink caffeinated beverages (e.g., coffee, colas, teas), and how much daily? Do you drink alcohol or use any drugs? Have you started any new medicines recently?       Family health and work related stresses  11. PATIENT SUPPORT: Who is with you now? Who do you live with? Do you have family or friends who you can talk to?        Has a good support system  12. OTHER SYMPTOMS: Do you have any other symptoms? (e.g., feeling depressed, trouble concentrating, trouble sleeping, trouble breathing, palpitations or fast heartbeat, chest pain, sweating, nausea, or diarrhea)       Elevated blood pressure when anxious  Protocols used: Anxiety and Panic Attack-A-AH

## 2024-04-27 ENCOUNTER — Other Ambulatory Visit: Payer: Self-pay | Admitting: Family Medicine

## 2024-04-27 DIAGNOSIS — J309 Allergic rhinitis, unspecified: Secondary | ICD-10-CM

## 2024-04-29 NOTE — Telephone Encounter (Signed)
 Too soon for refill, L[facility] 08/12/23 FOR 90 AND 2 RF.  Requested Prescriptions  Pending Prescriptions Disp Refills   levocetirizine (XYZAL ) 5 MG tablet [Pharmacy Med Name: LEVOCETIRIZINE 5 MG TABLET] 90 tablet 2    Sig: TAKE 1 TABLET BY MOUTH DAILY AS NEEDED FOR ALLERGIES.     Ear, Nose, and Throat:  Antihistamines - levocetirizine dihydrochloride  Passed - 04/29/2024  3:43 PM      Passed - Cr in normal range and within 360 days    Creat  Date Value Ref Range Status  06/05/2023 1.24 0.70 - 1.30 mg/dL Final         Passed - eGFR is 10 or above and within 360 days    GFR, Est African American  Date Value Ref Range Status  01/08/2021 94 > OR = 60 mL/min/1.27m2 Final   GFR, Est Non African American  Date Value Ref Range Status  01/08/2021 81 > OR = 60 mL/min/1.12m2 Final   eGFR  Date Value Ref Range Status  06/05/2023 71 > OR = 60 mL/min/1.61m2 Final         Passed - Valid encounter within last 12 months    Recent Outpatient Visits           5 months ago Hypertension, unspecified type   [facility] Leavy Mole, PA-C   6 months ago Hospital discharge follow-up   [facility] [place], Leisa, PA-C               montelukast  (SINGULAIR ) 10 MG tablet [Pharmacy Med Name: MONTELUKAST  SOD 10 MG TABLET] 90 tablet 2    Sig: TAKE 1 TABLET BY MOUTH EVERYDAY AT BEDTIME     Pulmonology:  Leukotriene Inhibitors Passed - 04/29/2024  3:43 PM      Passed - Valid encounter within last 12 months    Recent Outpatient Visits           5 months ago Hypertension, unspecified type   [facility] Leavy Mole, PA-C   6 months ago Hospital discharge follow-up   [facility] Leavy Mole, PA-C

## 2024-05-04 ENCOUNTER — Other Ambulatory Visit: Payer: Self-pay | Admitting: Family Medicine

## 2024-05-04 DIAGNOSIS — F419 Anxiety disorder, unspecified: Secondary | ICD-10-CM

## 2024-05-06 NOTE — Telephone Encounter (Signed)
 Requested Prescriptions  Pending Prescriptions Disp Refills   busPIRone  (BUSPAR ) 5 MG tablet [Pharmacy Med Name: BUSPIRONE  HCL 5 MG TABLET] 90 tablet 2    Sig: TAKE 1-3 TABLETS (5-15 MG TOTAL) BY MOUTH 2 (TWO) TIMES DAILY AS NEEDED (ANXIETY/STRESS).     Psychiatry: Anxiolytics/Hypnotics - Non-controlled Passed - 05/06/2024  5:40 PM      Passed - Valid encounter within last 12 months    Recent Outpatient Visits           5 months ago Hypertension, unspecified type   [facility] Leavy Mole, PA-C   6 months ago Hospital discharge follow-up   [facility] Leavy Mole, PA-C

## 2024-05-10 ENCOUNTER — Ambulatory Visit: Admitting: Family Medicine

## 2024-05-10 ENCOUNTER — Ambulatory Visit (INDEPENDENT_AMBULATORY_CARE_PROVIDER_SITE_OTHER): Admitting: Nurse Practitioner

## 2024-05-10 ENCOUNTER — Encounter: Payer: Self-pay | Admitting: Nurse Practitioner

## 2024-05-10 VITALS — BP 182/90 | HR 76 | Temp 97.6°F | Ht 68.0 in | Wt 209.0 lb

## 2024-05-10 DIAGNOSIS — J309 Allergic rhinitis, unspecified: Secondary | ICD-10-CM

## 2024-05-10 DIAGNOSIS — E785 Hyperlipidemia, unspecified: Secondary | ICD-10-CM

## 2024-05-10 DIAGNOSIS — K219 Gastro-esophageal reflux disease without esophagitis: Secondary | ICD-10-CM

## 2024-05-10 DIAGNOSIS — F419 Anxiety disorder, unspecified: Secondary | ICD-10-CM | POA: Insufficient documentation

## 2024-05-10 DIAGNOSIS — R0683 Snoring: Secondary | ICD-10-CM

## 2024-05-10 DIAGNOSIS — I10 Essential (primary) hypertension: Secondary | ICD-10-CM | POA: Insufficient documentation

## 2024-05-10 DIAGNOSIS — G43109 Migraine with aura, not intractable, without status migrainosus: Secondary | ICD-10-CM | POA: Diagnosis not present

## 2024-05-10 DIAGNOSIS — Z131 Encounter for screening for diabetes mellitus: Secondary | ICD-10-CM

## 2024-05-10 MED ORDER — LEVOCETIRIZINE DIHYDROCHLORIDE 5 MG PO TABS: 5.0000 mg | ORAL_TABLET | Freq: Every day | ORAL | 2 refills | Status: AC

## 2024-05-10 MED ORDER — MONTELUKAST SODIUM 10 MG PO TABS: ORAL_TABLET | ORAL | 2 refills | Status: AC

## 2024-05-10 MED ORDER — SERTRALINE HCL 25 MG PO TABS: 25.0000 mg | ORAL_TABLET | Freq: Every day | ORAL | 0 refills | Status: DC

## 2024-05-10 NOTE — Progress Notes (Signed)
 BP (!) 182/84   Pulse 76   Temp 97.6 F (36.4 C)   Ht 5' 8 (1.727 m)   Wt 209 lb (94.8 kg)   SpO2 99%   BMI 31.78 kg/m    Subjective:    Patient ID: Don Patterson, male    DOB: 1970/09/21, 53 y.o.   MRN: [id number]  HPI: Don Patterson is a 53 y.o. male  Chief Complaint  Patient presents with   Medication Refill   Anxiety   Discussed the use of AI scribe software for clinical note transcription with the patient, who gave verbal consent to proceed.  History of Present Illness Don Patterson is a 53 year old male who presents for a routine follow-up and medication refill.  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 - Symptoms include feeling anxious, 'tight', sometimes cold and shaking - Takes buspirone  5-15 mg twice daily as needed; during stressful weeks, takes an additional midday dose - Engages in exercise, Christian-based yoga, and meditation to manage anxiety  Migraine and ocular migraine symptoms - History of migraines, managed with Excedrin as needed - Experienced an ocular migraine last week, attributed to pressure changes - Underwent sinus surgery two years ago to address pressure-related ocular migraines  Allergic rhinitis symptoms - Takes Astelin  nasal spray twice daily, Xyzal  5 mg daily, Singulair  10 mg daily, and Nasacort two sprays daily for allergic rhinitis  Gastroesophageal reflux disease  symptoms - History of GERD; no current symptoms detailed in this encounter  Sleep disturbance and nocturnal hypoventilation concerns - No sleep study performed to date, but considering due to concerns about low breath rates during sleep as indicated by a mobile app - Maintains a consistent sleep routine - Uses a weighted blanket and blackout curtains to optimize sleep environment  Hyperlipidemia management - Takes atorvastatin  20 mg daily for hyperlipidemia         05/10/2024    1:08 PM 06/05/2023    8:36 AM 06/14/2022    1:52 PM  Depression screen PHQ 2/9  Decreased Interest 0 0 0  Down, Depressed, Hopeless 1 0 0  PHQ - 2 Score 1 0 0  Altered sleeping 0 0 0  Tired, decreased energy 0 0 0  Change in appetite 0 0 0  Feeling bad or failure about yourself  0 0 0  Trouble concentrating 1 0 0  Moving slowly or fidgety/restless 0 0 0  Suicidal thoughts 0 0 0  PHQ-9 Score 2 0  0   Difficult doing work/chores Not difficult at all  Not difficult at all     Data saved with a previous flowsheet row definition    Relevant past medical, surgical, family and social history reviewed and updated as indicated. Interim medical history since our last visit reviewed. Allergies and medications reviewed and updated.  Review of Systems  Constitutional: Negative for fever or weight change.  Respiratory: Negative for cough and shortness of breath.   Cardiovascular: Negative for  chest pain or palpitations.  Gastrointestinal: Negative for abdominal pain, no bowel changes.  Musculoskeletal: Negative for gait problem or joint swelling.  Skin: Negative for rash.  Neurological: Negative for dizziness or headache.  No other specific complaints in a complete review of systems (except as listed in HPI above).      Objective:      BP (!) 182/84   Pulse 76   Temp 97.6 F (36.4 C)   Ht 5' 8 (1.727 m)   Wt 209 lb (94.8 kg)   SpO2 99%   BMI 31.78 kg/m    Wt Readings from Last 3 Encounters:   05/10/24 209 lb (94.8 kg)  10/30/23 211 lb (95.7 kg)  06/05/23 210 lb (95.3 kg)    Physical Exam VITALS: BP- 182/84 GENERAL: Alert, cooperative, well developed, no acute distress. HEENT: Normocephalic, normal oropharynx, moist mucous membranes. CHEST: Clear to auscultation bilaterally, no wheezes, rhonchi, or crackles. CARDIOVASCULAR: Normal heart rate and rhythm, S1 and S2 normal without murmurs. ABDOMEN: Soft, non-tender, non-distended, without organomegaly, normal bowel sounds. EXTREMITIES: No cyanosis , mild edema NEUROLOGICAL: Cranial nerves grossly intact, moves all extremities without gross motor or sensory deficit.  Results for orders placed or performed in visit on 06/05/23  TSH   Collection Time: 06/05/23  9:24 AM  Result Value Ref Range   TSH 1.80 0.40 - 4.50 mIU/L  Hemoglobin A1c   Collection Time: 06/05/23  9:24 AM  Result Value Ref Range   Hgb A1c MFr Bld 5.6 <5.7 % of total Hgb   Mean Plasma Glucose 114 mg/dL   eAG (mmol/L) 6.3 mmol/L  Lipid panel   Collection Time: 06/05/23  9:24 AM  Result Value Ref Range   Cholesterol 192 <200 mg/dL   HDL 42 > OR = 40 mg/dL   Triglycerides 805 (H) <150 mg/dL   LDL Cholesterol (Calc) 119 (H) mg/dL (calc)   Total CHOL/HDL Ratio 4.6 <5.0 (calc)   Non-HDL Cholesterol (Calc) 150 (H) <130 mg/dL (calc)  CBC with Differential/Platelet   Collection Time: 06/05/23  9:24 AM  Result Value Ref Range   WBC 5.5 3.8 - 10.8 Thousand/uL   RBC 5.47 4.20 - 5.80 Million/uL   Hemoglobin 16.4 13.2 - 17.1 g/dL   HCT 50.9 61.4 - 49.9 %   MCV 89.6 80.0 - 100.0 fL   MCH 30.0 27.0 - 33.0 pg   MCHC 33.5 32.0 - 36.0 g/dL   RDW 87.4 88.9 - 84.9 %   Platelets 233 140 - 400 Thousand/uL   MPV 10.9 7.5 - 12.5 fL   Neutro Abs 3,284 1,500 - 7,800 cells/uL   Absolute Lymphocytes 1,573 850 - 3,900 cells/uL   Absolute Monocytes 462 200 - 950 cells/uL   Eosinophils Absolute 110 15 - 500 cells/uL   Basophils Absolute 72 0 - 200 cells/uL   Neutrophils  Relative % 59.7 %   Total Lymphocyte 28.6 %   Monocytes Relative 8.4 %   Eosinophils Relative 2.0 %   Basophils Relative 1.3 %  COMPLETE METABOLIC PANEL WITH GFR   Collection Time: 06/05/23  9:24 AM  Result Value Ref Range   Glucose, Bld 101 (H) 65 - 99 mg/dL   BUN 10 7 - 25 mg/dL   Creat 8.75 9.29 - 8.69 mg/dL   eGFR 71 > OR = 60 fO/fpw/8.26f7   BUN/Creatinine Ratio SEE NOTE: 6 - 22 (calc)   Sodium 140 135 - 146 mmol/L   Potassium 4.2 3.5 - 5.3 mmol/L   Chloride 102 98 -  110 mmol/L   CO2 27 20 - 32 mmol/L   Calcium  10.0 8.6 - 10.3 mg/dL   Total Protein 7.3 6.1 - 8.1 g/dL   Albumin 4.8 3.6 - 5.1 g/dL   Globulin 2.5 1.9 - 3.7 g/dL (calc)   AG Ratio 1.9 1.0 - 2.5 (calc)   Total Bilirubin 0.7 0.2 - 1.2 mg/dL   Alkaline phosphatase (APISO) 79 35 - 144 U/L   AST 27 10 - 35 U/L   ALT 46 9 - 46 U/L  PSA   Collection Time: 06/05/23  9:24 AM  Result Value Ref Range   PSA 0.51 < OR = 4.00 ng/mL          Assessment & Plan:   Problem List Items Addressed This Visit       Cardiovascular and Mediastinum   Migraine aura without headache - Primary   Relevant Medications   sertraline (ZOLOFT) 25 MG tablet   Hypertension     Respiratory   Allergic rhinitis   Relevant Medications   levocetirizine (XYZAL ) 5 MG tablet   montelukast  (SINGULAIR ) 10 MG tablet     Digestive   Gastroesophageal reflux disease without esophagitis     Other   Hyperlipidemia   Anxiety   Relevant Medications   sertraline (ZOLOFT) 25 MG tablet   Other Visit Diagnoses       Screening for diabetes mellitus         Snoring       Relevant Orders   Ambulatory referral to Pulmonology        Assessment and Plan Assessment & Plan Essential hypertension Blood pressure is elevated at 182/84 mmHg. Home readings show variability with higher readings in the morning, likely due to stress. Amlodipine  is taken in the morning with breakfast. Swelling in legs may be related to amlodipine  or dietary sodium  intake. - Continue amlodipine  5 mg daily. - Monitor blood pressure at home. - Will reassess blood pressure control after starting Zoloft.  Anxiety disorder Anxiety exacerbated by work stress, leading to elevated blood pressure. Currently using buspirone  as needed, with increased doses during high-stress periods. Discussed starting Zoloft for daily management due to its safety profile and efficacy. Zoloft is considered safer and is used in various populations, including pregnant individuals and children. Side effects include sexual dysfunction and weight gain, but these are less common with Zoloft and Lexapro compared to other SSRIs. - Started Zoloft at the lowest dose. - Scheduled follow-up in 4 weeks to assess response and adjust dosage if necessary.  Migraine with aura Experiences ocular migraines, often related to pressure changes. Uses Excedrin as needed for relief. - Continue Excedrin as needed for migraine relief.  Hyperlipidemia LDL was 119 mg/dL, triglycerides 805 mg/dL. Currently on atorvastatin  20 mg daily. - Continue atorvastatin  20 mg daily. - Ordered lab work to reassess lipid levels.  Allergic rhinitis Managed with Astelin  nasal spray, Xyzal , and Singulair . - Continue Astelin  nasal spray, Xyzal , and Singulair  as prescribed.  Gastroesophageal reflux disease (GERD)  Snoring, evaluation for possible sleep-disordered breathing Reports snoring and low breath rates during sleep. No prior sleep study conducted. Interested in evaluating for possible sleep-disordered breathing. - Referred to sleep specialist for home sleep study.  General Health Maintenance Overdue for lab work. Regular physical scheduled for December. - Ordered lab work to be done during scheduled physical in December.        Follow up plan: Return in about 4 weeks (around 06/07/2024) for follow up.

## 2024-05-17 ENCOUNTER — Encounter: Payer: Self-pay | Admitting: Nurse Practitioner

## 2024-05-17 ENCOUNTER — Other Ambulatory Visit: Payer: Self-pay | Admitting: Family Medicine

## 2024-05-17 DIAGNOSIS — F419 Anxiety disorder, unspecified: Secondary | ICD-10-CM

## 2024-05-19 NOTE — Telephone Encounter (Signed)
 Requested medications are due for refill today.  No - pt is requesting a 90 day supply  Requested medications are on the active medications list.  yes  Last refill. 05/06/2024 #90 2 rf  Future visit scheduled.   yes  Notes to clinic.  Pt is requesting a 90 day supply.    Requested Prescriptions  Pending Prescriptions Disp Refills   busPIRone  (BUSPAR ) 5 MG tablet [Pharmacy Med Name: BUSPIRONE  HCL 5 MG TABLET] 540 tablet 1    Sig: TAKE 1-3 TABLETS (5-15 MG TOTAL) BY MOUTH 2 (TWO) TIMES DAILY AS NEEDED (ANXIETY/STRESS).     Psychiatry: Anxiolytics/Hypnotics - Non-controlled Passed - 05/19/2024  2:27 PM      Passed - Valid encounter within last 12 months    Recent Outpatient Visits           1 week ago Migraine aura without headache   [facility] Gareth Mliss FALCON, FNP   6 months ago Hypertension, unspecified type   [facility] Leavy Mole, PA-C   6 months ago Hospital discharge follow-up   [facility] Leavy Mole, PA-C

## 2024-05-21 ENCOUNTER — Ambulatory Visit: Admitting: Sleep Medicine

## 2024-05-21 ENCOUNTER — Encounter: Payer: Self-pay | Admitting: Sleep Medicine

## 2024-05-21 ENCOUNTER — Other Ambulatory Visit: Payer: Self-pay | Admitting: Nurse Practitioner

## 2024-05-21 VITALS — BP 138/82 | HR 65 | Temp 98.5°F | Ht 68.0 in | Wt 206.8 lb

## 2024-05-21 DIAGNOSIS — Z6831 Body mass index (BMI) 31.0-31.9, adult: Secondary | ICD-10-CM | POA: Diagnosis not present

## 2024-05-21 DIAGNOSIS — E669 Obesity, unspecified: Secondary | ICD-10-CM

## 2024-05-21 DIAGNOSIS — G4733 Obstructive sleep apnea (adult) (pediatric): Secondary | ICD-10-CM | POA: Diagnosis not present

## 2024-05-21 DIAGNOSIS — I10 Essential (primary) hypertension: Secondary | ICD-10-CM

## 2024-05-21 DIAGNOSIS — F419 Anxiety disorder, unspecified: Secondary | ICD-10-CM

## 2024-05-21 MED ORDER — ESCITALOPRAM OXALATE 10 MG PO TABS: 10.0000 mg | ORAL_TABLET | Freq: Every day | ORAL | 0 refills | Status: DC

## 2024-05-21 NOTE — Progress Notes (Signed)
 Name:Don Patterson MRN: [id number] DOB: 1970-08-19   CHIEF COMPLAINT:  EXCESSIVE DAYTIME SLEEPINESS   HISTORY OF PRESENT ILLNESS: Don Patterson is a 53 y.o. w/ a h/o HTN, allergic rhinitis, hyperlipidemia, anxiety, depression and obesity who presents for c/o loud snoring which has been present for several years. Tracks his sleep and breathing with a watch and has noticed his respiratory rate is significantly variable. Reports nocturnal awakenings due to nocturia, however does not have difficulty falling back to sleep. Denies any significant weight changes. Denies morning headaches, RLS symptoms, dream enactment, cataplexy, hypnagogic or hypnapompic hallucinations. Denies a family history of sleep apnea. Denies drowsy driving. Drinks 1 cup of coffee daily, denies alcohol, tobacco or illicit drug use.   Bedtime 10-11 pm Sleep onset 10 mins Rise time 5-7:30 am   EPWORTH SLEEP SCORE 2    05/21/2024   11:17 AM  Results of the Epworth flowsheet  Sitting and reading 0  Watching TV 0  Sitting, inactive in a public place (e.g. a theatre or a meeting) 0  As a passenger in a car for an hour without a break 0  Lying down to rest in the afternoon when circumstances permit 2  Sitting and talking to someone 0  Sitting quietly after a lunch without alcohol 0  In a car, while stopped for a few minutes in traffic 0  Total score 2    PAST MEDICAL HISTORY :   has a past medical history of Allergy, Anxiety, Bilateral shoulder pain (01/14/2015), Colitis, GERD (gastroesophageal reflux disease), History of kidney stones, Hyperlipidemia LDL goal <130 (05/18/2016), Hypertension, Medication monitoring encounter (05/18/2016), Migraine, Palpitations (03/29/2015), and Sinus congestion.  has a past surgical history that includes Cyst removal neck (Left, 07/04/1997); Wisdom tooth extraction; Colonoscopy with propofol  (N/A, 04/19/2021); Esophagogastroduodenoscopy (N/A, 04/19/2021); Septoplasty  (Bilateral, 09/21/2022); and Turbinate reduction (Bilateral, 09/21/2022). Prior to Admission medications   Medication Sig Start Date End Date Taking? Authorizing Provider  amLODipine  (NORVASC ) 5 MG tablet Take 1 tablet (5 mg total) by mouth daily. 11/20/23   Tapia, Leisa, PA-C  atorvastatin  (LIPITOR) 20 MG tablet TAKE 1 TABLET BY MOUTH EVERYDAY AT BEDTIME 11/20/23   Tapia, Leisa, PA-C  azelastine  (ASTELIN ) 0.1 % nasal spray Place 1 spray into both nostrils 2 (two) times daily. Use in each nostril as directed 09/21/22   Milissa Hamming, MD  busPIRone  (BUSPAR ) 5 MG tablet TAKE 1-3 TABLETS (5-15 MG TOTAL) BY MOUTH 2 (TWO) TIMES DAILY AS NEEDED (ANXIETY/STRESS). 05/06/24   Tapia, Leisa, PA-C  escitalopram (LEXAPRO) 10 MG tablet Take 1 tablet (10 mg total) by mouth daily. 05/21/24   Pender, Julie F, FNP  Flaxseed, Linseed, (FLAX SEEDS PO) Take by mouth daily.    [provider]  GuaiFENesin (MUCINEX PO) Take as needed by mouth.    [provider]  levocetirizine (XYZAL ) 5 MG tablet Take 1 tablet (5 mg total) by mouth daily. 05/10/24   Gareth Mliss FALCON, FNP  montelukast  (SINGULAIR ) 10 MG tablet TAKE 1 TABLET BY MOUTH EVERYDAY AT BEDTIME 05/10/24   Pender, Julie F, FNP  Nutritional Supplements (FRUIT & VEGETABLE DAILY PO) Take by mouth daily at 6 (six) AM.    [provider]  sodium chloride  (OCEAN) 0.65 % SOLN nasal spray Place 1 spray into both nostrils as needed for congestion.    [provider]  triamcinolone (NASACORT) 55 MCG/ACT AERO nasal inhaler Place 2 sprays daily into the nose.     [provider]  Allergies  Allergen Reactions   Cat Dander    Dust Mite Extract Other (See Comments)   Tree Extract     FAMILY HISTORY:  family history includes Cancer in his mother; Heart disease in his father and paternal uncle; Hyperlipidemia in his mother; Varicose Veins in his father. SOCIAL HISTORY:  reports that he has never smoked. He has never used smokeless  tobacco. He reports current alcohol use. He reports that he does not use drugs.   Review of Systems:  Gen:  Denies  fever, sweats, chills weight loss  HEENT: Denies blurred vision, double vision, ear pain, eye pain, hearing loss, nose bleeds, sore throat Cardiac:  No dizziness, chest pain or heaviness, chest tightness,edema, No JVD Resp:   No cough, -sputum production, -shortness of breath,-wheezing, -hemoptysis,  Gi: Denies swallowing difficulty, stomach pain, nausea or vomiting, diarrhea, constipation, bowel incontinence Gu:  Denies bladder incontinence, burning urine Ext:   Denies Joint pain, stiffness or swelling Skin: Denies  skin rash, easy bruising or bleeding or hives Endoc:  Denies polyuria, polydipsia , polyphagia or weight change Psych:   Denies depression, insomnia or hallucinations  Other:  All other systems negative  VITAL SIGNS: BP 138/82   Pulse 99   Temp 98.5 F (36.9 C)   Ht 5' 8 (1.727 m)   Wt 206 lb 12.8 oz (93.8 kg)   SpO2 (!) 65%   BMI 31.44 kg/m    Physical Examination:   General Appearance: No distress  EYES PERRLA, EOM intact.   NECK Supple, No JVD Pulmonary: normal breath sounds, No wheezing.  CardiovascularNormal S1,S2.  No m/r/g.   Abdomen: Benign, Soft, non-tender. Skin:   warm, no rashes, no ecchymosis  Extremities: normal, no cyanosis, clubbing. Neuro:without focal findings,  speech normal  PSYCHIATRIC: Mood, affect within normal limits.   ASSESSMENT AND PLAN  OSA I suspect that OSA is likely present due to clinical presentation. Discussed the consequences of untreated sleep apnea. Advised not to drive drowsy for safety of patient and others. Will complete further evaluation with a home sleep study and follow up to review results.    HTN Stable, on current management. Following with PCP.   Obesity  Counseled patient on diet and lifestyle modification.    MEDICATION ADJUSTMENTS/LABS AND TESTS ORDERED: Recommend Sleep  Study   Patient  satisfied with Plan of action and management. All questions answered  Follow up to review HST results and treatment plan.   I spent a total of 39 minutes reviewing chart data, face-to-face evaluation with the patient, counseling and coordination of care as detailed above.    Talaya Lamprecht, M.D.  Sleep Medicine [facility]

## 2024-05-21 NOTE — Patient Instructions (Signed)
 Don Patterson

## 2024-05-22 ENCOUNTER — Encounter: Payer: Self-pay | Admitting: Sleep Medicine

## 2024-06-05 ENCOUNTER — Ambulatory Visit (INDEPENDENT_AMBULATORY_CARE_PROVIDER_SITE_OTHER): Admitting: Nurse Practitioner

## 2024-06-05 ENCOUNTER — Encounter: Payer: Self-pay | Admitting: Nurse Practitioner

## 2024-06-05 VITALS — BP 130/80 | HR 60 | Temp 97.0°F | Ht 68.0 in | Wt 208.0 lb

## 2024-06-05 DIAGNOSIS — Z0000 Encounter for general adult medical examination without abnormal findings: Secondary | ICD-10-CM | POA: Diagnosis not present

## 2024-06-05 DIAGNOSIS — Z131 Encounter for screening for diabetes mellitus: Secondary | ICD-10-CM | POA: Diagnosis not present

## 2024-06-05 DIAGNOSIS — Z125 Encounter for screening for malignant neoplasm of prostate: Secondary | ICD-10-CM

## 2024-06-05 DIAGNOSIS — Z13220 Encounter for screening for lipoid disorders: Secondary | ICD-10-CM | POA: Diagnosis not present

## 2024-06-05 DIAGNOSIS — Z130 Encounter for screening for diseases of the blood and blood-forming organs and certain disorders involving the immune mechanism: Secondary | ICD-10-CM | POA: Diagnosis not present

## 2024-06-05 DIAGNOSIS — I10 Essential (primary) hypertension: Secondary | ICD-10-CM

## 2024-06-05 DIAGNOSIS — E785 Hyperlipidemia, unspecified: Secondary | ICD-10-CM

## 2024-06-05 LAB — COMPREHENSIVE METABOLIC PANEL WITH GFR
AG Ratio: 2.1 (calc) (ref 1.0–2.5)
ALT: 43 U/L (ref 9–46)
AST: 28 U/L (ref 10–35)
Albumin: 4.8 g/dL (ref 3.6–5.1)
Alkaline phosphatase (APISO): 77 U/L (ref 35–144)
BUN: 12 mg/dL (ref 7–25)
CO2: 27 mmol/L (ref 20–32)
Calcium: 9.7 mg/dL (ref 8.6–10.3)
Chloride: 101 mmol/L (ref 98–110)
Creat: 1.21 mg/dL (ref 0.70–1.30)
Globulin: 2.3 g/dL (ref 1.9–3.7)
Glucose, Bld: 97 mg/dL (ref 65–99)
Potassium: 4.6 mmol/L (ref 3.5–5.3)
Sodium: 140 mmol/L (ref 135–146)
Total Bilirubin: 1 mg/dL (ref 0.2–1.2)
Total Protein: 7.1 g/dL (ref 6.1–8.1)
eGFR: 72 mL/min/1.73m2 (ref >=60)

## 2024-06-05 LAB — LIPID PANEL
Cholesterol: 162 mg/dL (ref <200)
HDL: 42 mg/dL (ref >=40)
LDL Cholesterol (Calc): 95 mg/dL
Non-HDL Cholesterol (Calc): 120 mg/dL (ref <130)
Total CHOL/HDL Ratio: 3.9 (calc) (ref <5.0)
Triglycerides: 145 mg/dL (ref <150)

## 2024-06-05 LAB — CBC WITH DIFFERENTIAL/PLATELET
Absolute Lymphocytes: 1884 {cells}/uL (ref 850–3900)
Absolute Monocytes: 552 {cells}/uL (ref 200–950)
Basophils Absolute: 72 {cells}/uL (ref 0–200)
Basophils Relative: 1.2 %
Eosinophils Absolute: 120 {cells}/uL (ref 15–500)
Eosinophils Relative: 2 %
HCT: 49.2 % (ref 39.4–51.1)
Hemoglobin: 16.5 g/dL (ref 13.2–17.1)
MCH: 30.2 pg (ref 27.0–33.0)
MCHC: 33.5 g/dL (ref 31.6–35.4)
MCV: 89.9 fL (ref 81.4–101.7)
MPV: 11.1 fL (ref 7.5–12.5)
Monocytes Relative: 9.2 %
Neutro Abs: 3372 {cells}/uL (ref 1500–7800)
Neutrophils Relative %: 56.2 %
Platelets: 268 Thousand/uL (ref 140–400)
RBC: 5.47 Million/uL (ref 4.20–5.80)
RDW: 13 % (ref 11.0–15.0)
Total Lymphocyte: 31.4 %
WBC: 6 Thousand/uL (ref 3.8–10.8)

## 2024-06-05 LAB — HEMOGLOBIN A1C
Hgb A1c MFr Bld: 5.6 % (ref <5.7)
Mean Plasma Glucose: 114 mg/dL
eAG (mmol/L): 6.3 mmol/L

## 2024-06-05 LAB — PSA: PSA: 0.58 ng/mL (ref <=4.00)

## 2024-06-05 NOTE — Progress Notes (Signed)
 Name: Don Patterson   MRN: [id number]    DOB: April 06, 1971   Date:06/05/2024       Progress Note  Subjective  Chief Complaint  Chief Complaint  Patient presents with   Annual Exam    HPI  Patient presents for annual CPE . Discussed the use of AI scribe software for clinical note transcription with the patient, who gave verbal consent to proceed.  History of Present Illness Don Patterson is a 53 year old male with hypertension, hyperlipidemia, and anxiety who presents for an annual physical exam.  Blood pressure and lipid management - Hypertension managed with amlodipine  5 mg daily - Home blood pressure readings average 125/72 mmHg - In-office blood pressure measured at 142/90 mmHg - Hyperlipidemia managed with atorvastatin  20 mg daily  Anxiety symptoms and pharmacologic therapy - Anxiety managed with Lexapro  after switching from Zoloft  in November due to intolerance - Stable on Lexapro  without reported adverse effects  Diet and exercise habits - Follows a meal plan with delivered meals to maintain a balanced diet and reduce eating out - Exercises regularly, working out at school four days a week with treadmill sessions - Participates in an hour-long Christian-based yoga class once a week  Sleep patterns - Typically sleeps six to seven hours per night - Bedtime at 10 PM and wakes at 5 AM  Hydration and beverage intake - Tracks liquid intake, primarily drinks water throughout the day - Occasional coffee, soda, coconut milk, and juice in the morning  Genitourinary function - No problems with urination    IPSS     Row Name 06/05/24 0820         International Prostate Symptom Score   How often have you had the sensation of not emptying your bladder? Less than 1 in 5     How often have you had to urinate less than every two hours? Less than half the time     How often have you found you stopped and started again several times when you urinated? Not at All      How often have you found it difficult to postpone urination? Not at All     How often have you had a weak urinary stream? Not at All     How often have you had to strain to start urination? Not at All     How many times did you typically get up at night to urinate? 1 Time     Total IPSS Score 4       Quality of Life due to urinary symptoms   If you were to spend the rest of your life with your urinary condition just the way it is now how would you feel about that? Pleased         Diet: well balanced diet Exercise: workouts 4 days a week,including cardio and yoga Sleep: 6-7 hours Last dental exam:a couple months ago Last eye exam: a couple years ago  Depression: phq 9 is negative    06/05/2024    8:39 AM 05/10/2024    1:08 PM 06/05/2023    8:36 AM 06/14/2022    1:52 PM 05/30/2022    8:09 AM  Depression screen PHQ 2/9  Decreased Interest 0 0 0 0 0  Down, Depressed, Hopeless 0 1 0 0 1  PHQ - 2 Score 0 1 0 0 1  Altered sleeping 0 0 0 0 1  Tired, decreased energy 0 0 0 0 1  Change in  appetite 0 0 0 0 0  Feeling bad or failure about yourself  0 0 0 0 0  Trouble concentrating 0 1 0 0 0  Moving slowly or fidgety/restless 0 0 0 0 0  Suicidal thoughts 0 0 0 0 0  PHQ-9 Score 0 2 0  0  3   Difficult doing work/chores Not difficult at all Not difficult at all  Not difficult at all Somewhat difficult     Data saved with a previous flowsheet row definition      06/05/2024    8:39 AM 05/10/2024    1:08 PM 05/30/2022    8:25 AM  GAD 7 : Generalized Anxiety Score  Nervous, Anxious, on Edge 0 1 2  Control/stop worrying 0 1 0  Worry too much - different things 0 1 0  Trouble relaxing 0 0 0  Restless 0 0 0  Easily annoyed or irritable 0 2 0  Afraid - awful might happen 0 1 0  Total GAD 7 Score 0 6 2  Anxiety Difficulty Not difficult at all Somewhat difficult Somewhat difficult      Hypertension:  BP Readings from Last 3 Encounters:  06/05/24 130/80  05/21/24 138/82  05/10/24  (!) 182/90    Obesity: Wt Readings from Last 3 Encounters:  06/05/24 208 lb (94.3 kg)  05/21/24 206 lb 12.8 oz (93.8 kg)  05/10/24 209 lb (94.8 kg)   BMI Readings from Last 3 Encounters:  06/05/24 31.63 kg/m  05/21/24 31.44 kg/m  05/10/24 31.78 kg/m     Lipids:  Lab Results  Component Value Date   CHOL 192 06/05/2023   CHOL 180 05/30/2022   CHOL 178 05/25/2021   Lab Results  Component Value Date   HDL 42 06/05/2023   HDL 48 05/30/2022   HDL 55 05/25/2021   Lab Results  Component Value Date   LDLCALC 119 (H) 06/05/2023   LDLCALC 107 (H) 05/30/2022   LDLCALC 103 (H) 05/25/2021   Lab Results  Component Value Date   TRIG 194 (H) 06/05/2023   TRIG 130 05/30/2022   TRIG 104 05/25/2021   Lab Results  Component Value Date   CHOLHDL 4.6 06/05/2023   CHOLHDL 3.8 05/30/2022   CHOLHDL 3.2 05/25/2021   No results found for: LDLDIRECT Glucose:  Glucose, Bld  Date Value Ref Range Status  06/05/2023 101 (H) 65 - 99 mg/dL Final    Comment:    .            Fasting reference interval . For someone without known diabetes, a glucose value between 100 and 125 mg/dL is consistent with prediabetes and should be confirmed with a follow-up test. .   05/30/2022 95 65 - 99 mg/dL Final    Comment:    .            Fasting reference interval .   01/08/2021 96 65 - 99 mg/dL Final    Comment:    .            Fasting reference interval .     Flowsheet Row Office Visit from 05/10/2024 in [facility]  AUDIT-C Score 1      Single STD testing and prevention (HIV/chl/gon/syphilis): completed Hep C: completed  Skin cancer: Discussed monitoring for atypical lesions Colorectal cancer: 04/19/2021 Prostate cancer: ordered Lab Results  Component Value Date   PSA 0.51 06/05/2023     Lung cancer:   Low Dose CT Chest recommended if Age 39-80  years, 30 pack-year currently smoking OR have quit w/in 15years. Patient does not qualify.   AAA:   The USPSTF recommends one-time screening with ultrasonography in men ages 81 to 15 years who have ever smoked ECG:  03/27/2015  Vaccines:  HPV: up to at age 50 , ask insurance if age between 16-45  Shingrix: 61-64 yo and ask insurance if covered when patient above 37 yo Pneumonia:  educated and discussed with patient. Flu:  educated and discussed with patient.  Advanced Care Planning: A voluntary discussion about advance care planning including the explanation and discussion of advance directives.  Discussed health care proxy and Living will, and the patient was able to identify a health care proxy as [employer] Brookes.  Patient does have a living will at present time. If patient does have living will, I have requested they bring this to the clinic to be scanned in to their chart.  Patient Active Problem List   Diagnosis Date Noted   Anxiety 05/10/2024   Hypertension 05/10/2024   Allergic rhinitis 11/20/2019   Overweight (BMI 25.0-29.9) 05/17/2018   Hyperlipidemia 05/18/2016   Migraine aura without headache 01/14/2015   Gastroesophageal reflux disease without esophagitis 01/14/2015    Past Surgical History:  Procedure Laterality Date   COLONOSCOPY WITH PROPOFOL  N/A 04/19/2021   Procedure: COLONOSCOPY WITH PROPOFOL ;  Surgeon: Unk Corinn Skiff, MD;  Location: [facility] ENDOSCOPY;  Service: Gastroenterology;  Laterality: N/A;   CYST REMOVAL NECK Left 07/04/1997   face   ESOPHAGOGASTRODUODENOSCOPY N/A 04/19/2021   Procedure: ESOPHAGOGASTRODUODENOSCOPY (EGD);  Surgeon: Unk Corinn Skiff, MD;  Location: [facility];  Service: Gastroenterology;  Laterality: N/A;   SEPTOPLASTY Bilateral 09/21/2022   Procedure: SEPTOPLASTY;  Surgeon: Milissa Hamming, MD;  Location: [facility];  Service: ENT;  Laterality: Bilateral;   TURBINATE REDUCTION Bilateral 09/21/2022   Procedure: BILATERAL INFERIOR TURBINATE REDUCTION RIGHT MIDDLE TURBINATE REDUCTION;  Surgeon: Milissa Hamming, MD;  Location:  [facility];  Service: ENT;  Laterality: Bilateral;   WISDOM TOOTH EXTRACTION      Family History  Problem Relation Age of Onset   Cancer Mother        breast and ovarian   Hyperlipidemia Mother    Heart disease Father    Varicose Veins Father    Heart disease Paternal Uncle     Social History   Socioeconomic History   Marital status: Single    Spouse name: Not on file   Number of children: Not on file   Years of education: 16   Highest education level: Bachelor's degree (e.g., BA, AB, BS)  Occupational History   Not on file  Tobacco Use   Smoking status: Never   Smokeless tobacco: Never  Vaping Use   Vaping status: Never Used  Substance and Sexual Activity   Alcohol use: Yes    Comment: A beer or cider a couple times a year. Less than 1 a month.   Drug use: Never   Sexual activity: Not Currently    Birth control/protection: None  Other Topics Concern   Not on file  Social History Narrative   Not on file   Social Drivers of Health   Financial Resource Strain: Low Risk  (05/06/2024)   Overall Financial Resource Strain (CARDIA)    Difficulty of Paying Living Expenses: Not hard at all  Food Insecurity: No Food Insecurity (05/06/2024)   Hunger Vital Sign    Worried About Running Out of Food in the Last Year: Never true    Ran  Out of Food in the Last Year: Never true  Transportation Needs: No Transportation Needs (05/06/2024)   PRAPARE - Administrator, Civil Service (Medical): No    Lack of Transportation (Non-Medical): No  Physical Activity: Sufficiently Active (05/06/2024)   Exercise Vital Sign    Days of Exercise per Week: 5 days    Minutes of Exercise per Session: 30 min  Stress: No Stress Concern Present (06/05/2024)   [employer] of Occupational Health - Occupational Stress Questionnaire    Feeling of Stress: Only a little  Recent Concern: Stress - Stress Concern Present (05/06/2024)   [employer] of Occupational Health -  Occupational Stress Questionnaire    Feeling of Stress: To some extent  Social Connections: Moderately Integrated (05/06/2024)   Social Connection and Isolation Panel    Frequency of Communication with Friends and Family: More than three times a week    Frequency of Social Gatherings with Friends and Family: Twice a week    Attends Religious Services: More than 4 times per year    Active Member of [employer] or Organizations: Yes    Attends Banker Meetings: More than 4 times per year    Marital Status: Never married  Intimate Partner Violence: Not At Risk (10/24/2023)   Received from [facility]   Humiliation, Afraid, Rape, and Kick questionnaire    Within the last year, have you been afraid of your partner or ex-partner?: No    Within the last year, have you been humiliated or emotionally abused in other ways by your partner or ex-partner?: No    Within the last year, have you been kicked, hit, slapped, or otherwise physically hurt by your partner or ex-partner?: No    Within the last year, have you been raped or forced to have any kind of sexual activity by your partner or ex-partner?: No     Current Outpatient Medications:    amLODipine  (NORVASC ) 5 MG tablet, Take 1 tablet (5 mg total) by mouth daily., Disp: 90 tablet, Rfl: 2   atorvastatin  (LIPITOR) 20 MG tablet, TAKE 1 TABLET BY MOUTH EVERYDAY AT BEDTIME, Disp: 90 tablet, Rfl: 2   azelastine  (ASTELIN ) 0.1 % nasal spray, Place 1 spray into both nostrils 2 (two) times daily. Use in each nostril as directed, Disp: 30 mL, Rfl: 12   escitalopram  (LEXAPRO ) 10 MG tablet, Take 1 tablet (10 mg total) by mouth daily., Disp: 30 tablet, Rfl: 0   Flaxseed, Linseed, (FLAX SEEDS PO), Take by mouth daily., Disp: , Rfl:    GuaiFENesin (MUCINEX PO), Take as needed by mouth., Disp: , Rfl:    levocetirizine (XYZAL ) 5 MG tablet, Take 1 tablet (5 mg total) by mouth daily., Disp: 90 tablet, Rfl: 2   montelukast  (SINGULAIR ) 10 MG tablet, TAKE 1  TABLET BY MOUTH EVERYDAY AT BEDTIME, Disp: 90 tablet, Rfl: 2   Nutritional Supplements (FRUIT & VEGETABLE DAILY PO), Take by mouth daily at 6 (six) AM., Disp: , Rfl:    sodium chloride  (OCEAN) 0.65 % SOLN nasal spray, Place 1 spray into both nostrils as needed for congestion., Disp: , Rfl:    triamcinolone (NASACORT) 55 MCG/ACT AERO nasal inhaler, Place 2 sprays daily into the nose. , Disp: , Rfl:   Allergies  Allergen Reactions   Cat Dander    Dust Mite Extract Other (See Comments)   Tree Extract      ROS  Constitutional: Negative for fever or weight change.  Respiratory: Negative for cough  and shortness of breath.   Cardiovascular: Negative for chest pain or palpitations.  Gastrointestinal: Negative for abdominal pain, no bowel changes.  Musculoskeletal: Negative for gait problem or joint swelling.  Skin: Negative for rash.  Neurological: Negative for dizziness or headache.  No other specific complaints in a complete review of systems (except as listed in HPI above).    Objective  Vitals:   06/05/24 0804 06/05/24 0839  BP: (!) 142/90 130/80  Pulse: 60   Temp: (!) 97 F (36.1 C)   SpO2: 99%   Weight: 208 lb (94.3 kg)   Height: 5' 8 (1.727 m)     Body mass index is 31.63 kg/m.  Physical Exam Vitals reviewed.  Constitutional:      Appearance: Normal appearance.  HENT:     Head: Normocephalic.     Right Ear: Tympanic membrane normal.     Left Ear: Tympanic membrane normal.     Nose: Nose normal.  Eyes:     Extraocular Movements: Extraocular movements intact.     Conjunctiva/sclera: Conjunctivae normal.     Pupils: Pupils are equal, round, and reactive to light.  Neck:     Thyroid : No thyroid  mass, thyromegaly or thyroid  tenderness.  Cardiovascular:     Rate and Rhythm: Normal rate and regular rhythm.     Pulses: Normal pulses.     Heart sounds: Normal heart sounds.  Pulmonary:     Effort: Pulmonary effort is normal.     Breath sounds: Normal breath sounds.   Abdominal:     General: Bowel sounds are normal.     Palpations: Abdomen is soft.  Musculoskeletal:        General: Normal range of motion.     Cervical back: Normal range of motion and neck supple.     Right lower leg: No edema.     Left lower leg: No edema.  Skin:    General: Skin is warm and dry.     Capillary Refill: Capillary refill takes less than 2 seconds.  Neurological:     General: No focal deficit present.     Mental Status: He is alert and oriented to person, place, and time. Mental status is at baseline.  Psychiatric:        Mood and Affect: Mood normal.        Behavior: Behavior normal.        Thought Content: Thought content normal.        Judgment: Judgment normal.      No results found for this or any previous visit (from the past 2160 hours).   Fall Risk:    06/05/2023    8:36 AM 06/14/2022    1:51 PM 05/30/2022    8:09 AM 05/25/2021    8:18 AM 01/08/2021    9:47 AM  Fall Risk   Falls in the past year? 0 0 0 0 0  Number falls in past yr: 0 0 0 0 0  Injury with Fall? 0  0  0  0  0   Risk for fall due to : No Fall Risks No Fall Risks No Fall Risks    Follow up Falls prevention discussed Falls prevention discussed;Education provided;Falls evaluation completed  Falls prevention discussed;Education provided;Falls evaluation completed        Data saved with a previous flowsheet row definition      Functional Status Survey: Is the patient deaf or have difficulty hearing?: No Does the patient have difficulty seeing, even when wearing  glasses/contacts?: No Does the patient have difficulty concentrating, remembering, or making decisions?: No Does the patient have difficulty walking or climbing stairs?: No Does the patient have difficulty dressing or bathing?: No Does the patient have difficulty doing errands alone such as visiting a doctor's office or shopping?: No    Assessment & Plan  Problem List Items Addressed This Visit       Cardiovascular  and Mediastinum   Hypertension   Relevant Orders   CBC with Differential/Platelet   Comprehensive metabolic panel with GFR     Other   Hyperlipidemia   Relevant Orders   Lipid panel   Other Visit Diagnoses       Annual physical exam    -  Primary   Relevant Orders   CBC with Differential/Platelet   Comprehensive metabolic panel with GFR   Hemoglobin A1c   Lipid panel   PSA     Screening for deficiency anemia       Relevant Orders   CBC with Differential/Platelet     Screening for cholesterol level       Relevant Orders   Lipid panel     Screening for diabetes mellitus       Relevant Orders   Comprehensive metabolic panel with GFR   Hemoglobin A1c     Screening for prostate cancer       Relevant Orders   PSA      Assessment and Plan Assessment & Plan Adult Wellness Visit Annual physical examination conducted. Colorectal cancer screening is up to date. Last dental exam was a couple of months ago, with the next one due in January or February. Last eye exam was a couple of years ago. No smoking history. Medical power of attorney is Rankin Fuchs.  Essential hypertension Blood pressure elevated at 142/90 in the office, likely due to white coat syndrome. Home blood pressure readings are normal at 125/72. - Rechecked blood pressure in the office  Hyperlipidemia Currently managed with atorvastatin  20 mg daily.  Anxiety disorder Managed with Lexapro , which is well-tolerated and effective. Previous intolerance to Zoloft  noted. - Continue Lexapro  for anxiety management     -Prostate cancer screening and PSA options (with potential risks and benefits of testing vs not testing) were discussed along with recent recs/guidelines. -USPSTF grade A and B recommendations reviewed with patient; age-appropriate recommendations, preventive care, screening tests, etc discussed and encouraged; healthy living encouraged; see AVS for patient education given to patient -Discussed  importance of 150 minutes of physical activity weekly, eat two servings of fish weekly, eat one serving of tree nuts ( cashews, pistachios, pecans, almonds.SABRA) every other day, eat 6 servings of fruit/vegetables daily and drink plenty of water and avoid sweet beverages.  -Reviewed Health Maintenance: yes

## 2024-06-06 ENCOUNTER — Encounter

## 2024-06-06 ENCOUNTER — Ambulatory Visit: Payer: Self-pay | Admitting: Nurse Practitioner

## 2024-06-07 ENCOUNTER — Telehealth: Admitting: Nurse Practitioner

## 2024-06-07 DIAGNOSIS — F419 Anxiety disorder, unspecified: Secondary | ICD-10-CM

## 2024-06-07 MED ORDER — ESCITALOPRAM OXALATE 10 MG PO TABS: 10.0000 mg | ORAL_TABLET | Freq: Every day | ORAL | 1 refills | Status: AC

## 2024-06-07 NOTE — Progress Notes (Signed)
 Name: Don Patterson   MRN: [id number]    DOB: 09-26-1970   Date:06/07/2024       Progress Note  Subjective  Chief Complaint  Chief Complaint  Patient presents with   Medical Management of Chronic Issues    I connected with  Don Patterson  on 06/07/24 at  2:20 PM EST by a video enabled telemedicine application and verified that I am speaking with the correct person using two identifiers.  I discussed the limitations of evaluation and management by telemedicine and the availability of in person appointments. The patient expressed understanding and agreed to proceed with a virtual visit  Staff also discussed with the patient that there may be a patient responsible charge related to this service. Patient Location: work Dispensing Optician: [facility] Additional Individuals present: alone  HPI   Discussed the use of AI scribe software for clinical note transcription with the patient, who gave verbal consent to proceed.  History of Present Illness Don Patterson is a 53 year old male who presents for a four-week follow-up for anxiety.  Anxiety symptoms and treatment - Four-week follow-up for anxiety management - Started on Lexapro  10 mg daily - Doing well on medication - Anxiety screening negative at this time  Depressive symptoms - Depression screening negative at this time        06/05/2024    8:39 AM 05/10/2024    1:08 PM 06/05/2023    8:36 AM 06/14/2022    1:52 PM 05/30/2022    8:09 AM  Depression screen PHQ 2/9  Decreased Interest 0 0 0 0 0  Down, Depressed, Hopeless 0 1 0 0 1  PHQ - 2 Score 0 1 0 0 1  Altered sleeping 0 0 0 0 1  Tired, decreased energy 0 0 0 0 1  Change in appetite 0 0 0 0 0  Feeling bad or failure about yourself  0 0 0 0 0  Trouble concentrating 0 1 0 0 0  Moving slowly or fidgety/restless 0 0 0 0 0  Suicidal thoughts 0 0 0 0 0  PHQ-9 Score 0 2 0  0  3   Difficult doing work/chores Not difficult at all Not difficult at all  Not  difficult at all Somewhat difficult     Data saved with a previous flowsheet row definition       06/05/2024    8:39 AM 05/10/2024    1:08 PM 05/30/2022    8:25 AM  GAD 7 : Generalized Anxiety Score  Nervous, Anxious, on Edge 0 1 2  Control/stop worrying 0 1 0  Worry too much - different things 0 1 0  Trouble relaxing 0 0 0  Restless 0 0 0  Easily annoyed or irritable 0 2 0  Afraid - awful might happen 0 1 0  Total GAD 7 Score 0 6 2  Anxiety Difficulty Not difficult at all Somewhat difficult Somewhat difficult     Patient Active Problem List   Diagnosis Date Noted   Anxiety 05/10/2024   Hypertension 05/10/2024   Allergic rhinitis 11/20/2019   Overweight (BMI 25.0-29.9) 05/17/2018   Hyperlipidemia 05/18/2016   Migraine aura without headache 01/14/2015   Gastroesophageal reflux disease without esophagitis 01/14/2015    Social History   Tobacco Use   Smoking status: Never   Smokeless tobacco: Never  Substance Use Topics   Alcohol use: Yes    Comment: A beer or cider a couple times a year. Less than  1 a month.     Current Outpatient Medications:    amLODipine  (NORVASC ) 5 MG tablet, Take 1 tablet (5 mg total) by mouth daily., Disp: 90 tablet, Rfl: 2   atorvastatin  (LIPITOR) 20 MG tablet, TAKE 1 TABLET BY MOUTH EVERYDAY AT BEDTIME, Disp: 90 tablet, Rfl: 2   azelastine  (ASTELIN ) 0.1 % nasal spray, Place 1 spray into both nostrils 2 (two) times daily. Use in each nostril as directed, Disp: 30 mL, Rfl: 12   escitalopram  (LEXAPRO ) 10 MG tablet, Take 1 tablet (10 mg total) by mouth daily., Disp: 30 tablet, Rfl: 0   Flaxseed, Linseed, (FLAX SEEDS PO), Take by mouth daily., Disp: , Rfl:    GuaiFENesin (MUCINEX PO), Take as needed by mouth., Disp: , Rfl:    levocetirizine (XYZAL ) 5 MG tablet, Take 1 tablet (5 mg total) by mouth daily., Disp: 90 tablet, Rfl: 2   montelukast  (SINGULAIR ) 10 MG tablet, TAKE 1 TABLET BY MOUTH EVERYDAY AT BEDTIME, Disp: 90 tablet, Rfl: 2   Nutritional  Supplements (FRUIT & VEGETABLE DAILY PO), Take by mouth daily at 6 (six) AM., Disp: , Rfl:    sodium chloride  (OCEAN) 0.65 % SOLN nasal spray, Place 1 spray into both nostrils as needed for congestion., Disp: , Rfl:    triamcinolone (NASACORT) 55 MCG/ACT AERO nasal inhaler, Place 2 sprays daily into the nose. , Disp: , Rfl:   Allergies  Allergen Reactions   Cat Dander    Dust Mite Extract Other (See Comments)   Tree Extract     I personally reviewed active problem list, medication list, allergies, notes from last encounter with the patient/caregiver today.  ROS  Constitutional: Negative for fever or weight change.  Respiratory: Negative for cough and shortness of breath.   Cardiovascular: Negative for chest pain or palpitations.  Gastrointestinal: Negative for abdominal pain, no bowel changes.  Musculoskeletal: Negative for gait problem or joint swelling.  Skin: Negative for rash.  Neurological: Negative for dizziness or headache.  No other specific complaints in a complete review of systems (except as listed in HPI above).   Objective  Virtual encounter, vitals not obtained.  There is no height or weight on file to calculate BMI.  Nursing Note and Vital Signs reviewed.  Physical Exam  Awake, alert and oriented, speaking in complete sentences   Results for orders placed or performed in visit on 06/05/24 (from the past 72 hours)  CBC with Differential/Platelet     Status: None   Collection Time: 06/05/24  8:34 AM  Result Value Ref Range   WBC 6.0 3.8 - 10.8 Thousand/uL   RBC 5.47 4.20 - 5.80 Million/uL   Hemoglobin 16.5 13.2 - 17.1 g/dL   HCT 50.7 60.5 - 48.8 %   MCV 89.9 81.4 - 101.7 fL   MCH 30.2 27.0 - 33.0 pg   MCHC 33.5 31.6 - 35.4 g/dL    Comment: For adults, a slight decrease in the calculated MCHC value (in the range of 30 to 32 g/dL) is most likely not clinically significant; however, it should be interpreted with caution in correlation with other red cell  parameters and the patient's clinical condition.    RDW 13.0 11.0 - 15.0 %   Platelets 268 140 - 400 Thousand/uL   MPV 11.1 7.5 - 12.5 fL   Neutro Abs 3,372 1,500 - 7,800 cells/uL   Absolute Lymphocytes 1,884 850 - 3,900 cells/uL   Absolute Monocytes 552 200 - 950 cells/uL   Eosinophils Absolute 120 15 -  500 cells/uL   Basophils Absolute 72 0 - 200 cells/uL   Neutrophils Relative % 56.2 %   Total Lymphocyte 31.4 %   Monocytes Relative 9.2 %   Eosinophils Relative 2.0 %   Basophils Relative 1.2 %  Comprehensive metabolic panel with GFR     Status: None   Collection Time: 06/05/24  8:34 AM  Result Value Ref Range   Glucose, Bld 97 65 - 99 mg/dL    Comment: .            Fasting reference interval .    BUN 12 7 - 25 mg/dL   Creat 8.78 9.29 - 8.69 mg/dL   eGFR 72 > OR = 60 fO/fpw/8.26f7   BUN/Creatinine Ratio SEE NOTE: 6 - 22 (calc)    Comment:    Not Reported: BUN and Creatinine are within    reference range. .    Sodium 140 135 - 146 mmol/L   Potassium 4.6 3.5 - 5.3 mmol/L   Chloride 101 98 - 110 mmol/L   CO2 27 20 - 32 mmol/L   Calcium  9.7 8.6 - 10.3 mg/dL   Total Protein 7.1 6.1 - 8.1 g/dL   Albumin 4.8 3.6 - 5.1 g/dL   Globulin 2.3 1.9 - 3.7 g/dL (calc)   AG Ratio 2.1 1.0 - 2.5 (calc)   Total Bilirubin 1.0 0.2 - 1.2 mg/dL   Alkaline phosphatase (APISO) 77 35 - 144 U/L   AST 28 10 - 35 U/L   ALT 43 9 - 46 U/L  Hemoglobin A1c     Status: None   Collection Time: 06/05/24  8:34 AM  Result Value Ref Range   Hgb A1c MFr Bld 5.6 <5.7 %    Comment: For the purpose of screening for the presence of diabetes: . <5.7%       Consistent with the absence of diabetes 5.7-6.4%    Consistent with increased risk for diabetes             (prediabetes) > or =6.5%  Consistent with diabetes . This assay result is consistent with a decreased risk of diabetes. . Currently, no consensus exists regarding use of hemoglobin A1c for diagnosis of diabetes in children. . According to  American Diabetes Association (ADA) guidelines, hemoglobin A1c <7.0% represents optimal control in non-pregnant diabetic patients. Different metrics may apply to specific patient populations.  Standards of Medical Care in Diabetes(ADA). .    Mean Plasma Glucose 114 mg/dL   eAG (mmol/L) 6.3 mmol/L  Lipid panel     Status: None   Collection Time: 06/05/24  8:34 AM  Result Value Ref Range   Cholesterol 162 <200 mg/dL   HDL 42 > OR = 40 mg/dL   Triglycerides 854 <849 mg/dL   LDL Cholesterol (Calc) 95 mg/dL (calc)    Comment: Reference range: <100 . Desirable range <100 mg/dL for primary prevention;   <70 mg/dL for patients with CHD or diabetic patients  with > or = 2 CHD risk factors. SABRA LDL-C is now calculated using the Martin-Hopkins  calculation, which is a validated novel method providing  better accuracy than the Friedewald equation in the  estimation of LDL-C.  Gladis APPLETHWAITE et al. SANDREA. 7986;689(80): 2061-2068  (http://education.[facility].com/faq/FAQ164)    Total CHOL/HDL Ratio 3.9 <5.0 (calc)   Non-HDL Cholesterol (Calc) 120 <130 mg/dL (calc)    Comment: For patients with diabetes plus 1 major ASCVD risk  factor, treating to a non-HDL-C goal of <100 mg/dL  (LDL-C of <29  mg/dL) is considered a therapeutic  option.   PSA     Status: None   Collection Time: 06/05/24  8:34 AM  Result Value Ref Range   PSA 0.58 < OR = 4.00 ng/mL    Comment: The total PSA value from this assay system is  standardized against the WHO standard. The test  result will be approximately 20% lower when compared  to the equimolar-standardized total PSA (Beckman  Coulter). Comparison of serial PSA results should be  interpreted with this fact in mind. . This test was performed using the Siemens  chemiluminescent method. Values obtained from  different assay methods cannot be used interchangeably. PSA levels, regardless of value, should not be interpreted as absolute evidence of the presence  or absence of disease.     Assessment & Plan  Assessment and Plan Assessment & Plan Anxiety disorder Well-managed with Lexapro  10 mg daily. He reports doing well on the medication. Anxiety screening is negative, indicating effective management of symptoms. Patient doing well on lexapro .  Denies any side effects.  Continue current treatment.  - Continue Lexapro  10 mg daily - Provided medication refills      -Red flags and when to present for emergency care or RTC including fever >101.20F, chest pain, shortness of breath, new/worsening/un-resolving symptoms,  reviewed with patient at time of visit. Follow up and care instructions discussed and provided in AVS. - I discussed the assessment and treatment plan with the patient. The patient was provided an opportunity to ask questions and all were answered. The patient agreed with the plan and demonstrated an understanding of the instructions.  I provided 15 minutes of non-face-to-face time during this encounter.  Mliss JULIANNA Spray, FNP

## 2024-06-18 DIAGNOSIS — R069 Unspecified abnormalities of breathing: Secondary | ICD-10-CM | POA: Diagnosis not present

## 2024-07-03 ENCOUNTER — Ambulatory Visit: Payer: Self-pay

## 2024-07-03 DIAGNOSIS — E669 Obesity, unspecified: Secondary | ICD-10-CM

## 2024-07-03 DIAGNOSIS — I10 Essential (primary) hypertension: Secondary | ICD-10-CM

## 2024-07-03 DIAGNOSIS — G4733 Obstructive sleep apnea (adult) (pediatric): Secondary | ICD-10-CM

## 2024-08-12 ENCOUNTER — Ambulatory Visit: Admitting: Sleep Medicine

## 2025-06-09 ENCOUNTER — Encounter: Admitting: Nurse Practitioner
